# Patient Record
Sex: Male | Born: 1935
Health system: Southern US, Community
[De-identification: ages and names within clinical notes are randomized; demographics above are authoritative.]

## PROBLEM LIST (undated history)

## (undated) DIAGNOSIS — I4892 Unspecified atrial flutter: Secondary | ICD-10-CM

## (undated) DIAGNOSIS — R42 Dizziness and giddiness: Secondary | ICD-10-CM

## (undated) DIAGNOSIS — I10 Essential (primary) hypertension: Secondary | ICD-10-CM

## (undated) DIAGNOSIS — I499 Cardiac arrhythmia, unspecified: Secondary | ICD-10-CM

## (undated) HISTORY — DX: Cardiac arrhythmia, unspecified: I49.9

## (undated) HISTORY — PX: ANKLE ARTHROSCOPY: SUR85

## (undated) HISTORY — DX: Unspecified atrial flutter: I48.92

## (undated) HISTORY — DX: Dizziness and giddiness: R42

## (undated) HISTORY — PX: TONSILLECTOMY: SUR1361

---

## 2000-08-19 ENCOUNTER — Ambulatory Visit (HOSPITAL_COMMUNITY): Admission: RE | Admit: 2000-08-19 | Discharge: 2000-08-19 | Payer: Self-pay | Admitting: Gastroenterology

## 2001-03-10 ENCOUNTER — Emergency Department (HOSPITAL_COMMUNITY): Admission: EM | Admit: 2001-03-10 | Discharge: 2001-03-10 | Payer: Self-pay | Admitting: Emergency Medicine

## 2001-06-09 ENCOUNTER — Encounter: Payer: Self-pay | Admitting: Internal Medicine

## 2001-06-09 ENCOUNTER — Encounter: Admission: RE | Admit: 2001-06-09 | Discharge: 2001-06-09 | Payer: Self-pay | Admitting: Internal Medicine

## 2004-09-02 ENCOUNTER — Ambulatory Visit (HOSPITAL_BASED_OUTPATIENT_CLINIC_OR_DEPARTMENT_OTHER): Admission: RE | Admit: 2004-09-02 | Discharge: 2004-09-02 | Payer: Self-pay | Admitting: Orthopedic Surgery

## 2004-09-02 ENCOUNTER — Ambulatory Visit (HOSPITAL_COMMUNITY): Admission: RE | Admit: 2004-09-02 | Discharge: 2004-09-02 | Payer: Self-pay | Admitting: Orthopedic Surgery

## 2012-02-09 ENCOUNTER — Other Ambulatory Visit: Payer: Self-pay | Admitting: Dermatology

## 2013-07-10 ENCOUNTER — Other Ambulatory Visit: Payer: Self-pay | Admitting: Internal Medicine

## 2013-07-10 ENCOUNTER — Ambulatory Visit
Admission: RE | Admit: 2013-07-10 | Discharge: 2013-07-10 | Disposition: A | Discharge status: Home / Self Care | Payer: Medicare Other | Source: Ambulatory Visit | Attending: Internal Medicine | Admitting: Internal Medicine

## 2013-07-10 DIAGNOSIS — R05 Cough: Secondary | ICD-10-CM

## 2013-07-10 DIAGNOSIS — R059 Cough, unspecified: Secondary | ICD-10-CM

## 2013-08-08 ENCOUNTER — Other Ambulatory Visit: Payer: Self-pay | Admitting: Dermatology

## 2015-10-01 IMAGING — CR DG CHEST 2V
2 series · 2 of 2 positions shown · non-contrast
Comparison: None.

CLINICAL DATA: Persistent cough, fatigue

EXAM:
CHEST  2 VIEW

[view not recorded (1 of 2)]
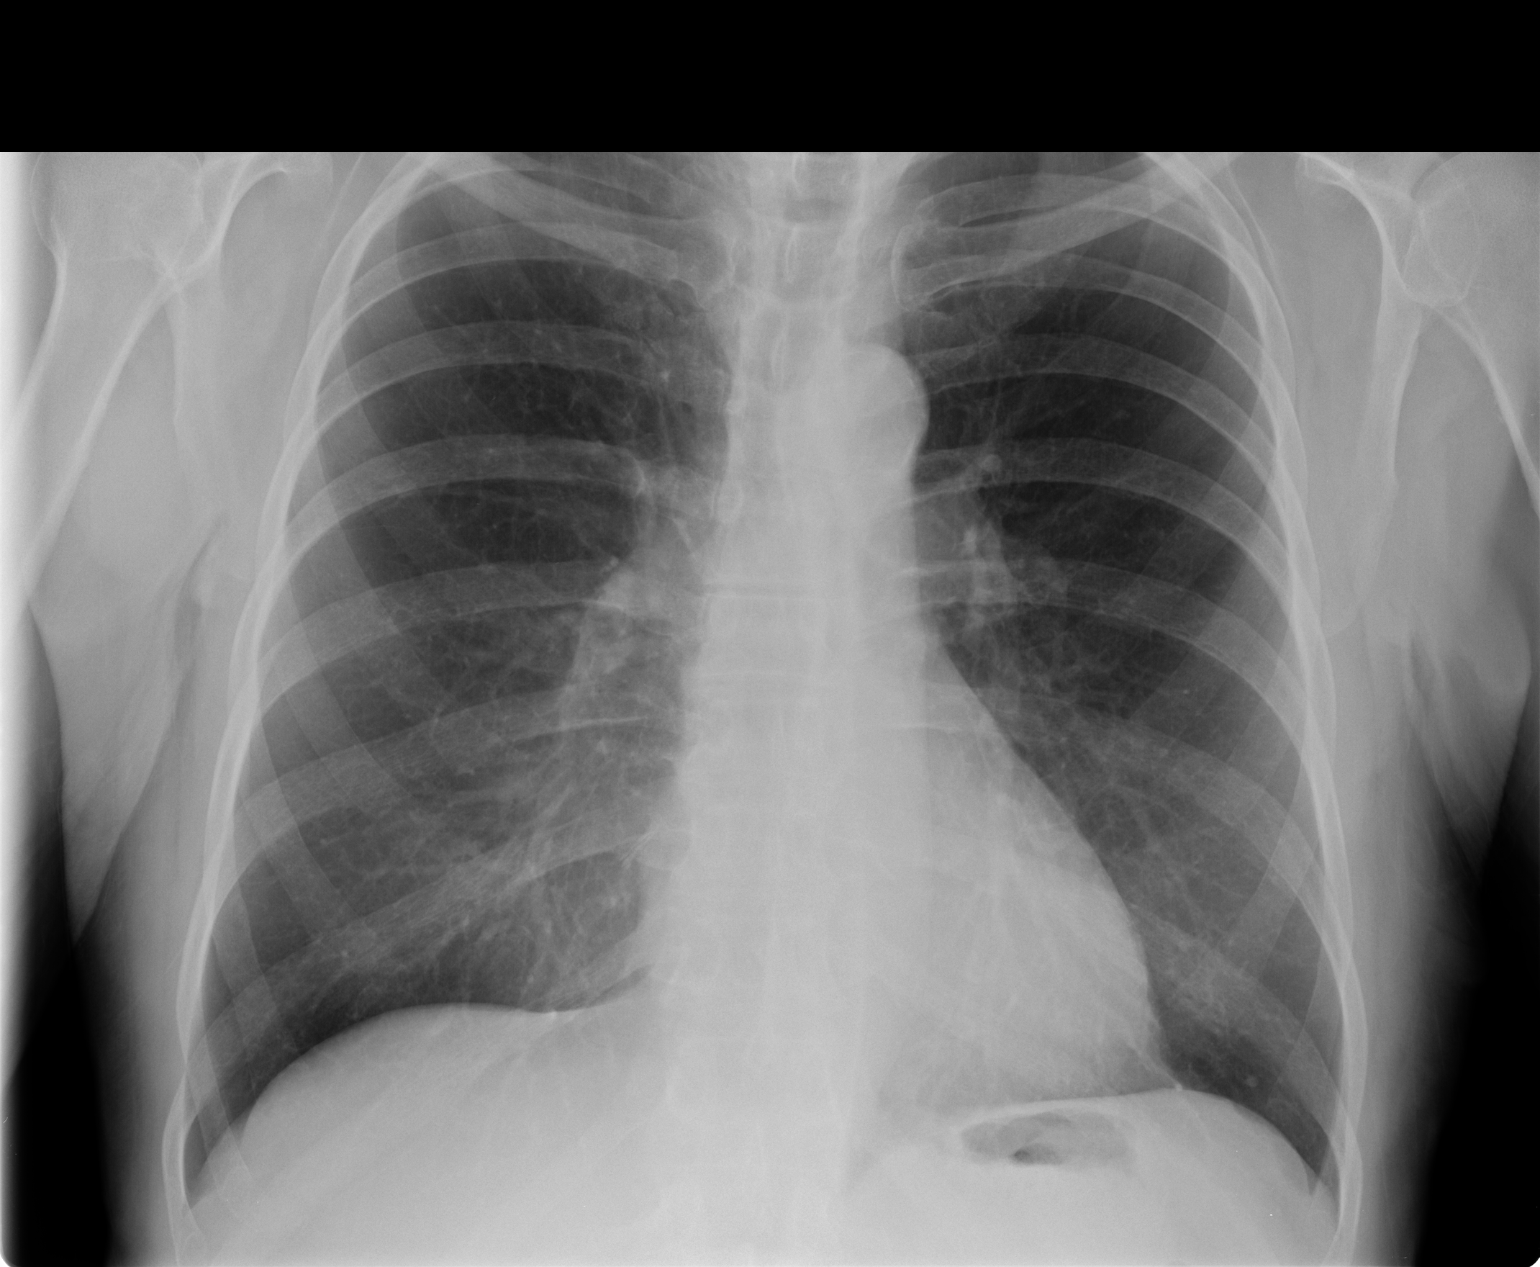

[view not recorded (2 of 2)]
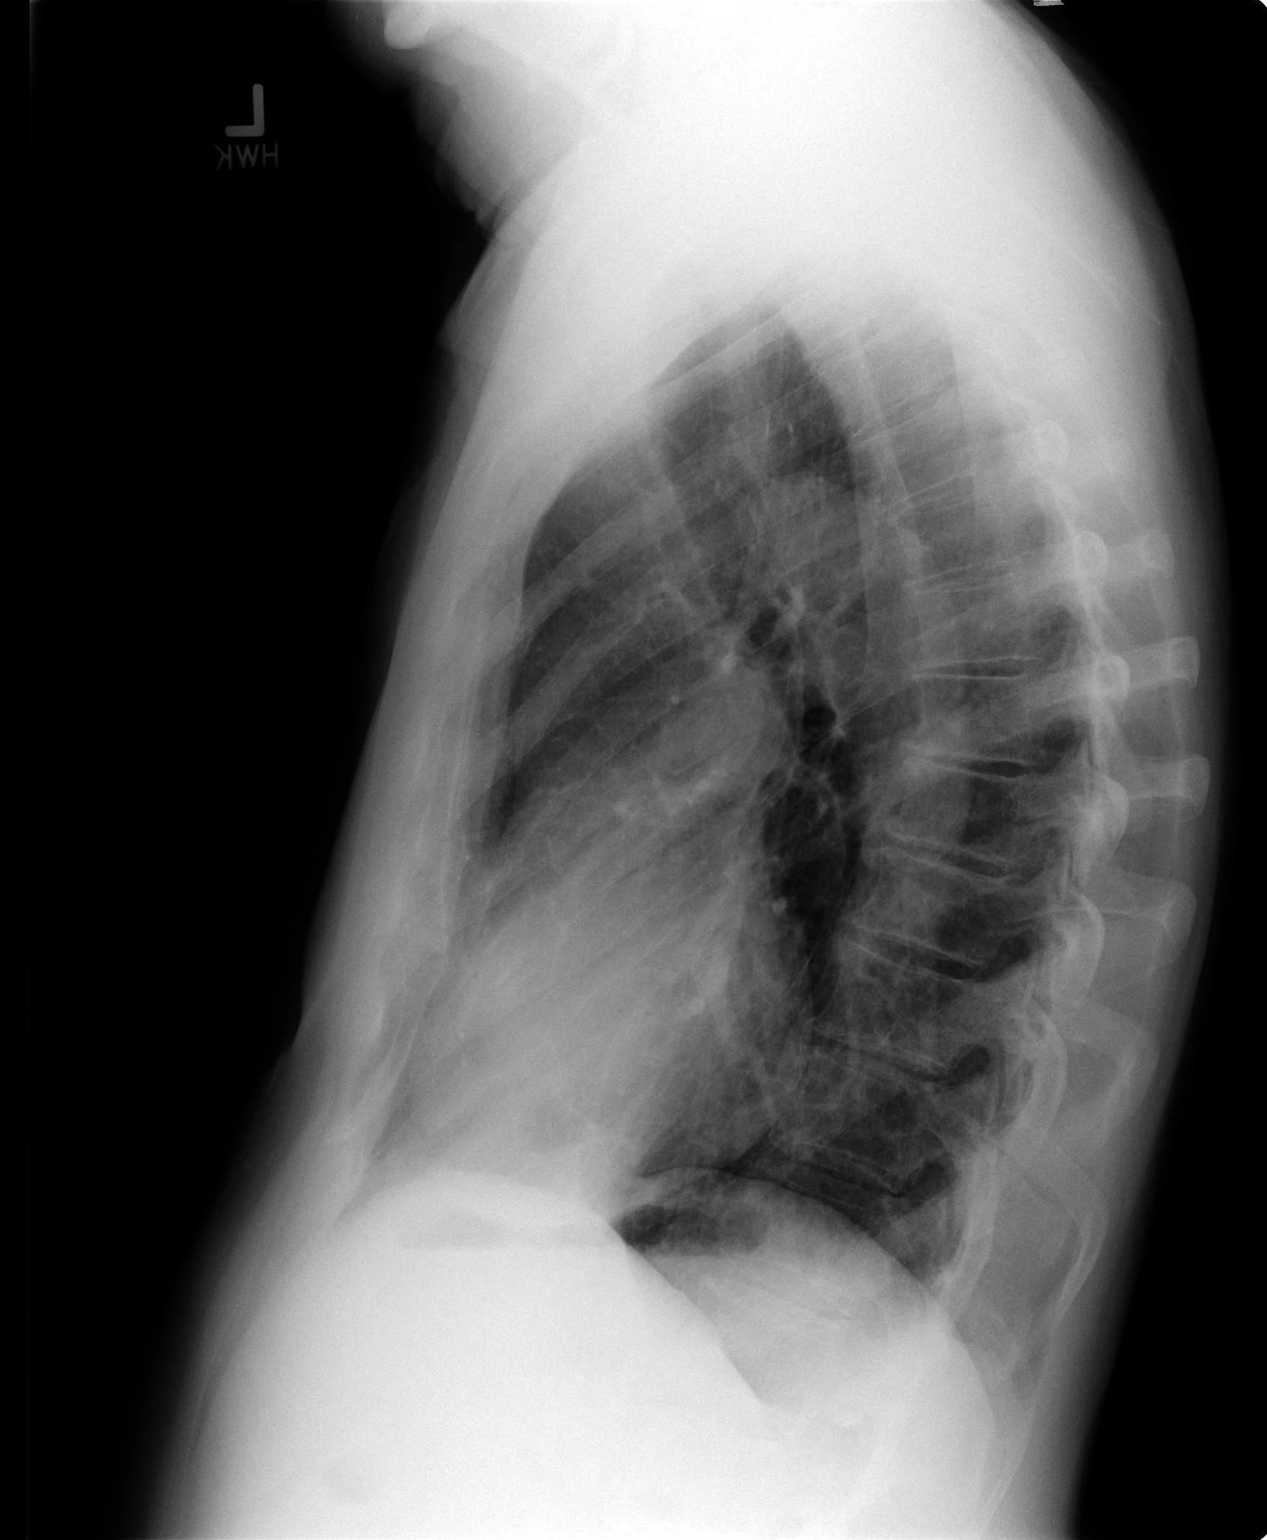

[2 of 2 positions shown; findings below may reference images not displayed]

FINDINGS: Lungs are clear.  No pleural effusion or pneumothorax.

The heart is normal in size.

Degenerative changes of the visualized thoracolumbar spine.
IMPRESSION: No evidence of acute cardiopulmonary disease.

## 2015-11-15 ENCOUNTER — Encounter (HOSPITAL_COMMUNITY): Payer: Self-pay | Admitting: *Deleted

## 2015-11-15 ENCOUNTER — Telehealth: Payer: Self-pay | Admitting: Internal Medicine

## 2015-11-15 ENCOUNTER — Emergency Department (HOSPITAL_COMMUNITY)
Admission: EM | Admit: 2015-11-15 | Discharge: 2015-11-15 | Disposition: A | Discharge status: Home / Self Care | Payer: Medicare Other | Attending: Emergency Medicine | Admitting: Emergency Medicine

## 2015-11-15 ENCOUNTER — Emergency Department (HOSPITAL_COMMUNITY): Payer: Medicare Other

## 2015-11-15 ENCOUNTER — Encounter: Payer: Self-pay | Admitting: *Deleted

## 2015-11-15 DIAGNOSIS — I4892 Unspecified atrial flutter: Secondary | ICD-10-CM | POA: Diagnosis not present

## 2015-11-15 DIAGNOSIS — Z87891 Personal history of nicotine dependence: Secondary | ICD-10-CM | POA: Insufficient documentation

## 2015-11-15 DIAGNOSIS — I10 Essential (primary) hypertension: Secondary | ICD-10-CM | POA: Diagnosis not present

## 2015-11-15 DIAGNOSIS — R42 Dizziness and giddiness: Secondary | ICD-10-CM | POA: Insufficient documentation

## 2015-11-15 DIAGNOSIS — R009 Unspecified abnormalities of heart beat: Secondary | ICD-10-CM | POA: Diagnosis present

## 2015-11-15 HISTORY — DX: Essential (primary) hypertension: I10

## 2015-11-15 LAB — BASIC METABOLIC PANEL
Anion gap: 9 (ref 5–15)
BUN: 18 mg/dL (ref 6–20)
CALCIUM: 9.5 mg/dL (ref 8.9–10.3)
CHLORIDE: 103 mmol/L (ref 101–111)
CO2: 23 mmol/L (ref 22–32)
CREATININE: 1.09 mg/dL (ref 0.61–1.24)
GFR calc Af Amer: >60 mL/min (ref >60)
GFR calc non Af Amer: 59 mL/min — ABNORMAL LOW (ref >60)
GLUCOSE: 101 mg/dL — AB (ref 65–99)
Potassium: 4.5 mmol/L (ref 3.5–5.1)
Sodium: 135 mmol/L (ref 135–145)

## 2015-11-15 LAB — CBC
HCT: 45.4 % (ref 39.0–52.0)
HEMOGLOBIN: 15 g/dL (ref 13.0–17.0)
MCH: 32.2 pg (ref 26.0–34.0)
MCHC: 33 g/dL (ref 30.0–36.0)
MCV: 97.4 fL (ref 78.0–100.0)
PLATELETS: 170 10*3/uL (ref 150–400)
RBC: 4.66 MIL/uL (ref 4.22–5.81)
RDW: 12.6 % (ref 11.5–15.5)
WBC: 8.2 10*3/uL (ref 4.0–10.5)

## 2015-11-15 LAB — I-STAT TROPONIN, ED: TROPONIN I, POC: 0 ng/mL (ref 0.00–0.08)

## 2015-11-15 NOTE — ED Provider Notes (Signed)
[facility] DEPT Provider Note   CSN: [id number] Arrival date & time: 11/15/15  1537  First Provider Contact:  First MD Initiated Contact with Patient 11/15/15 1741      History   Chief Complaint Chief Complaint  Patient presents with  . Irregular Heart Beat   HPI  Hardin Dar is an 82 y.o. male with history of HTN who presents to the ED from his PCP office for evaluation of new onset afib. Pt was seen at PCP today for evaluation of dizziness. Pt states for the past year to year and a half he has been experiencing episodes of dizziness that he describes as "debilitating." The episodes last 15-20 minutes and he feels dizzy, off balance, like the room is spinning, with unsteady gait. His previous PCP diagnosed his symptoms as vertigo; his symptoms have not resolved with vestibular rehab. Pt states this morning he felt one of the episodes coming on as he was sitting at the side of the bed so he made an appointment with his new PCP for check-up. Pt states he has never had afib before. He denies chest pain or shortness of breath. Denies palpitations. Denies dizziness currently in the ED. He denies numbness, weakness, or tingling.   Past Medical History:  Diagnosis Date  . Hypertension     There are no active problems to display for this patient.   No past surgical history on file.   Home Medications    Prior to Admission medications   Not on File    Family History No family history on file.  Social History Social History  Substance Use Topics  . Smoking status: Former Research scientist (life sciences)  . Smokeless tobacco: Never Used  . Alcohol use Yes     Allergies   Sulfa antibiotics   Review of Systems Review of Systems 10 Systems reviewed and are negative for acute change except as noted in the HPI.   Physical Exam Updated Vital Signs BP (!) 184/127   Pulse 88   Temp 98.2 F (36.8 C) (Oral)   Ht 5' 8.5" (1.74 m)   Wt 72.6 kg   SpO2 98%   BMI 23.97 kg/m   Physical Exam    Constitutional: He is oriented to person, place, and time.  HENT:  Right Ear: External ear normal.  Left Ear: External ear normal.  Nose: Nose normal.  Mouth/Throat: Oropharynx is clear and moist. No oropharyngeal exudate.  Eyes: Conjunctivae and EOM are normal. Pupils are equal, round, and reactive to light.  Neck: Normal range of motion. Neck supple.  Cardiovascular: Normal rate, normal heart sounds and intact distal pulses.  An irregularly irregular rhythm present.  Pulmonary/Chest: Effort normal and breath sounds normal. No respiratory distress. He has no wheezes.  Abdominal: Soft. Bowel sounds are normal. He exhibits no distension. There is no tenderness.  Musculoskeletal: He exhibits no edema.  Neurological: He is alert and oriented to person, place, and time. No cranial nerve deficit.  Normal finger to nose No pronator drift 5/5 strength bilateral UE and LE  Skin: Skin is warm and dry.  Psychiatric: He has a normal mood and affect.  Nursing note and vitals reviewed.    ED Treatments / Results  Labs (all labs ordered are listed, but only abnormal results are displayed) Labs Reviewed  BASIC METABOLIC PANEL - Abnormal; Notable for the following:       Result Value   Glucose, Bld 101 (*)    GFR calc non Af Amer 59 (*)  All other components within normal limits  CBC  I-STAT TROPOININ, ED    EKG  EKG Interpretation None       Radiology Dg Chest 2 View  Result Date: 11/15/2015 CLINICAL DATA:  Anus.  New onset atrial fibrillation. EXAM: CHEST  2 VIEW COMPARISON:  Two-view chest x-ray 07/10/2013. FINDINGS: The heart size and mediastinal contours are within normal limits. Both lungs are clear. The visualized skeletal structures are unremarkable. IMPRESSION: Negative two view chest x-ray. Electronically Signed   By: San Morelle M.D.   On: 11/15/2015 17:19  Mr Brain Wo Contrast  Result Date: 11/15/2015 CLINICAL DATA:  Vertigo. New onset of atrial fibrillation.  Dizziness. Hypertension. EXAM: MRI HEAD WITHOUT CONTRAST TECHNIQUE: Multiplanar, multiecho pulse sequences of the brain and surrounding structures were obtained without intravenous contrast. COMPARISON:  None. FINDINGS: No evidence for acute infarction, hemorrhage, mass lesion, hydrocephalus, or extra-axial fluid. Global atrophy, with prominence of the ventricles, cisterns, and sulci. Moderately advanced T2 and FLAIR hyperintensities throughout the white matter, consistent with chronic microvascular ischemic change. Flow voids are maintained throughout the carotid, basilar, and vertebral arteries. There are no areas of chronic hemorrhage. Pituitary, pineal, and cerebellar tonsils unremarkable. No upper cervical lesions. Visualized calvarium, skull base, and upper cervical osseous structures unremarkable. Scalp and extracranial soft tissues, orbits, sinuses, and mastoids show no acute process. IMPRESSION: Chronic changes as described. No acute intracranial findings. No cerebral infarct/embolus is detected. Electronically Signed   By: Staci Righter M.D.   On: 11/15/2015 20:13   Procedures Procedures (including critical care time)  Medications Ordered in ED Medications - No data to display   Initial Impression / Assessment and Plan / ED Course  I have reviewed the triage vital signs and the nursing notes.  Pertinent labs & imaging results that were available during my care of the patient were reviewed by me and considered in my medical decision making (see chart for details).  Clinical Course   Pt presenting from PCP office with new onset afib. Only symptoms are intermittent dizziness over the past year. Most recent episode occurred this morning. Now in the ED pt denies dizziness though feels if he gets up to walk around it might return. Workup thus far is negative including troponin, CXR, CBC, and BMP. We will obtain MRI to r/o stroke or other emergent intra-cranial abnormality. Will consult  cardiology.  6:50 PM Spoke with Dr. Domenic Polite of cardiology. EKG looks more like flutter, good rate control. If CNS workup is negative in the ED we may start conversation about anticoagulation with pt but ultimately might be better for pt to decide with PCP regarding anticoagulation choice, though NOAC would be his suggestion. Doesn't look like we need to start anticoagulation acutely. No indication for admission at this point.  MRI  Negative. Pt is asymptomatic. He is hemodynamically stable and stable for discharge. We did discuss different anticoagulation options (CHA2DS2VASc 3) but ultimately he will have close f/u with cardiology and PCP next week for further discussion and long term management. Strict ER return precautions given.  Final Clinical Impressions(s) / ED Diagnoses   Final diagnoses:  Atrial flutter, unspecified type ([place])  Dizziness    New Prescriptions There are no discharge medications for this patient.    Anne Ng, PA-C 11/16/15 [place], MD 11/16/15 1026

## 2015-11-15 NOTE — Discharge Instructions (Signed)
Follow up with your primary care provider and the [facility] as soon as possible early next week. Return to the ER for new or worsening symptoms.

## 2015-11-15 NOTE — ED Notes (Signed)
Report given to Susanna RN.

## 2015-11-15 NOTE — ED Triage Notes (Signed)
The pt is here after going to his doctor s office for a check up.  He was sent here when they did his ekg and found him to be in af  Which is new no pain of any kind  This is new onset

## 2015-11-15 NOTE — ED Notes (Signed)
Taken to MRI at this time

## 2015-11-15 NOTE — Evaluation (Signed)
     Phone consultation from Ms. Sam PA-C regarding Mr. [place]. Patient sent to ER by PCP today with newly documented atrial arrhythmia. He has a history of hypertension, no reported bleeding problems, no prior history of stroke or diabetes mellitus. ECG shows atrial flutter with 3:1 block and controlled heart rate. No reported palpitations or chest pain per discussion with Ms. Sam PA-C. He has had some dizziness, and workup for possible CNS event is underway with head MRI pending. Medications not yet loaded into Epic, reportedly on 1 antihypertensive, not clear whether this is an AV nodal blocker or not. He may well have underlying conduction system disease at age 40 with controlled heart rate in atrial flutter. Patient is noted to be hypertensive, lab work shows creatinine 1.1, potassium 4.5, troponin I 0.0, hemoglobin 15, and platelets 170. Chest x-ray without acute findings.  At this point agree with workup to exclude CNS event that would prompt hospitalization and more prompt initiation of anticoagulation. If this does not turn out to be the case and the decision is to discharge him home, he may well be able to undergo further outpatient cardiology evaluation with echocardiography, review of old charts to see if this rhythm is truly new or just newly documented, and discussion about appropriateness of anticoagulation for stroke prophylaxis. CHADSVASC score is 2. Eliquis might be a reasonable option for him. It is not clear at this point that cardioversion needs to be considered.  Satira Sark, M.D., F.A.C.C.

## 2015-11-19 ENCOUNTER — Encounter: Payer: Self-pay | Admitting: Internal Medicine

## 2015-11-19 ENCOUNTER — Encounter: Payer: Self-pay | Admitting: Interventional Cardiology

## 2015-11-19 ENCOUNTER — Telehealth (HOSPITAL_COMMUNITY): Payer: Self-pay | Admitting: *Deleted

## 2015-11-19 NOTE — Telephone Encounter (Signed)
Left message to schedule appt for follow up ER visit.

## 2015-12-13 ENCOUNTER — Ambulatory Visit: Payer: Medicare Other | Admitting: Internal Medicine

## 2016-05-15 ENCOUNTER — Other Ambulatory Visit: Payer: Self-pay | Admitting: Cardiology

## 2016-05-15 DIAGNOSIS — H34211 Partial retinal artery occlusion, right eye: Secondary | ICD-10-CM

## 2016-05-18 ENCOUNTER — Ambulatory Visit (HOSPITAL_COMMUNITY)
Admission: RE | Admit: 2016-05-18 | Discharge: 2016-05-18 | Disposition: A | Discharge status: Home / Self Care | Payer: Medicare Other | Source: Ambulatory Visit | Attending: Surgery | Admitting: Surgery

## 2016-05-18 DIAGNOSIS — H34211 Partial retinal artery occlusion, right eye: Secondary | ICD-10-CM | POA: Diagnosis not present

## 2016-05-18 DIAGNOSIS — I6523 Occlusion and stenosis of bilateral carotid arteries: Secondary | ICD-10-CM | POA: Diagnosis not present

## 2016-05-18 DIAGNOSIS — H34219 Partial retinal artery occlusion, unspecified eye: Secondary | ICD-10-CM | POA: Diagnosis present

## 2016-05-18 LAB — VAS US CAROTID
LCCAPDIAS: 17 cm/s
LEFT ECA DIAS: -12 cm/s
LEFT VERTEBRAL DIAS: -13 cm/s
LICAPDIAS: -17 cm/s
LICAPSYS: -61 cm/s
Left CCA dist dias: -15 cm/s
Left CCA dist sys: -73 cm/s
Left CCA prox sys: 104 cm/s
Left ICA dist dias: -29 cm/s
Left ICA dist sys: -108 cm/s
RIGHT CCA MID DIAS: 12 cm/s
RIGHT ECA DIAS: -12 cm/s
RIGHT VERTEBRAL DIAS: -9 cm/s
Right CCA prox dias: 14 cm/s
Right CCA prox sys: 93 cm/s
Right cca dist sys: -89 cm/s

## 2018-03-29 ENCOUNTER — Telehealth: Payer: Self-pay | Admitting: Cardiology

## 2018-04-12 NOTE — Telephone Encounter (Signed)
Done

## 2018-05-24 NOTE — Progress Notes (Signed)
Cardiology Office Note   Date:  05/30/2018   ID:  Tejuan Gholson, DOB February 15, 1927, MRN [id number]  PCP:  Leeroy Cha, MD  Cardiologist:   Shadrach Bartunek [place], MD   Chief Complaint  Patient presents with  . Atrial Flutter      History of Present Illness: Scott Richards is a 85 y.o. male who is seen at the request of Dr. Fara Olden for evaluation of cardiac arrhythmia. He is a former patient of Dr [name]. He has a history of HTN and atrial flutter. He was seen in the ED in July 2017 with newly recognized Atrial flutter with 4:1 AV conduction. Rate 48 bpm. MRI brain showed no acute change. Seen by Dr. [name] and started on Xarelto. Echo showed normal LV function. Mild to moderate AI and moderate MR. Subsequently converted to NSR.   He has done well since then. Denies any chest pain, palpitations, dyspnea or edema. He does have intermittent vertigo. Tolerating medication well. He is active and enjoys woodworking.    Past Medical History:  Diagnosis Date  . Arrhythmia   . Atrial flutter ([place])   . Hypertension   . Vertigo     Past Surgical History:  Procedure Laterality Date  . ANKLE ARTHROSCOPY    . TONSILLECTOMY       Current Outpatient Medications  Medication Sig Dispense Refill  . rivaroxaban (XARELTO) 20 MG TABS tablet Take 20 mg by mouth daily with supper.    . telmisartan (MICARDIS) 20 MG tablet Take 20 mg by mouth daily.      No current facility-administered medications for this visit.     Allergies:   Sulfa antibiotics    Social History:  The patient  reports that he has quit smoking. He has never used smokeless tobacco. He reports current alcohol use.   Family History:  The patient's family history includes Dementia in his mother.    ROS:  Please see the history of present illness.   Otherwise, review of systems are positive for none.   All other systems are reviewed and negative.    PHYSICAL EXAM: VS:  BP (!) 148/74   Pulse 70   Ht 5' 8.5" (1.74 m)    Wt 153 lb 6.4 oz (69.6 kg)   SpO2 98%   BMI 22.99 kg/m  , BMI Body mass index is 22.99 kg/m. GEN: Well nourished, well developed, in no acute distress  HEENT: normal  Neck: no JVD, carotid bruits, or masses Cardiac: RRR; 1/6 systolic  Murmur at the apex, no rubs, or gallops,no edema  Respiratory:  clear to auscultation bilaterally, normal work of breathing GI: soft, nontender, nondistended, + BS MS: no deformity or atrophy  Skin: warm and dry, no rash Neuro:  Strength and sensation are intact Psych: euthymic mood, full affect   EKG:  EKG is ordered today. The ekg ordered today demonstrates NSR with first degree AV block. One PVC. Otherwise normal. I have personally reviewed and interpreted this study.    Recent Labs: No results found for requested labs within last 8760 hours.    Lipid Panel No results found for: CHOL, TRIG, HDL, CHOLHDL, VLDL, LDLCALC, LDLDIRECT   Dated 11/24/17: cholesterol 179, triglycerides 87. HDL 53, LDL 109. Hgb normal Dated 05/25/18: normal BMET  Wt Readings from Last 3 Encounters:  05/30/18 153 lb 6.4 oz (69.6 kg)  11/15/15 160 lb (72.6 kg)      Other studies Reviewed: Additional studies/ records that were reviewed today include: office records from  Dr [name]. Review of the above records demonstrates: as noted above   ASSESSMENT AND PLAN:  1.  Paroxysmal AFlutter. Asymptomatic. Continue Xarelto for [place] Vasc score of 2.  2. HTN on Micardis.  Plan: follow up in one year.   Current medicines are reviewed at length with the patient today.  The patient does not have concerns regarding medicines.  The following changes have been made:  no change  Labs/ tests ordered today include: none No orders of the defined types were placed in this encounter.    Disposition:   FU with me  in 1 year  Signed, Chamberlain Steinborn [place], MD  05/30/2018 1:38 PM    [facility] [address], [place], [place], [address] Phone [phone],  Fax [phone]

## 2018-05-30 ENCOUNTER — Ambulatory Visit: Payer: Medicare Other | Admitting: Cardiology

## 2018-05-30 ENCOUNTER — Encounter: Payer: Self-pay | Admitting: Cardiology

## 2018-05-30 VITALS — BP 148/74 | HR 70 | Ht 68.5 in | Wt 153.4 lb

## 2018-05-30 DIAGNOSIS — I483 Typical atrial flutter: Secondary | ICD-10-CM | POA: Diagnosis not present

## 2019-06-08 ENCOUNTER — Telehealth: Payer: Self-pay

## 2019-06-08 NOTE — Progress Notes (Deleted)
Cardiology Office Note   Date:  06/08/2019   ID:  Scott Richards, DOB 1926-08-30, MRN [id number]  PCP:  Leeroy Cha, MD  Cardiologist:   Dai Apel [place], MD   No chief complaint on file.     History of Present Illness: Scott Richards is a 86 y.o. male who is seen at the request of Dr. Fara Olden for evaluation of cardiac arrhythmia. He is a former patient of Dr [name]. He has a history of HTN and atrial flutter. He was seen in the ED in July 2017 with newly recognized Atrial flutter with 4:1 AV conduction. Rate 48 bpm. MRI brain showed no acute change. Seen by Dr. [name] and started on Xarelto. Echo showed normal LV function. Mild to moderate AI and moderate MR. Subsequently converted to NSR.   He has done well since then. Denies any chest pain, palpitations, dyspnea or edema. He does have intermittent vertigo. Tolerating medication well. He is active and enjoys woodworking.    Past Medical History:  Diagnosis Date  . Arrhythmia   . Atrial flutter ([place])   . Hypertension   . Vertigo     Past Surgical History:  Procedure Laterality Date  . ANKLE ARTHROSCOPY    . TONSILLECTOMY       Current Outpatient Medications  Medication Sig Dispense Refill  . rivaroxaban (XARELTO) 20 MG TABS tablet Take 20 mg by mouth daily with supper.    . telmisartan (MICARDIS) 20 MG tablet Take 20 mg by mouth daily.      No current facility-administered medications for this visit.    Allergies:   Sulfa antibiotics    Social History:  The patient  reports that he has quit smoking. He has never used smokeless tobacco. He reports current alcohol use.   Family History:  The patient's family history includes Dementia in his mother.    ROS:  Please see the history of present illness.   Otherwise, review of systems are positive for none.   All other systems are reviewed and negative.    PHYSICAL EXAM: VS:  There were no vitals taken for this visit. , BMI There is no height or weight on  file to calculate BMI. GEN: Well nourished, well developed, in no acute distress  HEENT: normal  Neck: no JVD, carotid bruits, or masses Cardiac: RRR; 1/6 systolic  Murmur at the apex, no rubs, or gallops,no edema  Respiratory:  clear to auscultation bilaterally, normal work of breathing GI: soft, nontender, nondistended, + BS MS: no deformity or atrophy  Skin: warm and dry, no rash Neuro:  Strength and sensation are intact Psych: euthymic mood, full affect   EKG:  EKG is ordered today. The ekg ordered today demonstrates NSR with first degree AV block. One PVC. Otherwise normal. I have personally reviewed and interpreted this study.    Recent Labs: No results found for requested labs within last 8760 hours.    Lipid Panel No results found for: CHOL, TRIG, HDL, CHOLHDL, VLDL, LDLCALC, LDLDIRECT   Dated 11/24/17: cholesterol 179, triglycerides 87. HDL 53, LDL 109. Hgb normal Dated 05/25/18: normal BMET Dated 11/28/18: cholesterol 171, triglycerides 88, HDL 56, LDL 97. Creatinine 1.21. CBC and CMET otherwise normal.  Wt Readings from Last 3 Encounters:  05/30/18 153 lb 6.4 oz (69.6 kg)  11/15/15 160 lb (72.6 kg)      Other studies Reviewed: Additional studies/ records that were reviewed today include: office records from Dr [name]. Review of the above records demonstrates: as noted  above   ASSESSMENT AND PLAN:  1.  Paroxysmal AFlutter. Asymptomatic. Continue Xarelto for [place] Vasc score of 2.  2. HTN on Micardis.  Plan: follow up in one year.   Current medicines are reviewed at length with the patient today.  The patient does not have concerns regarding medicines.  The following changes have been made:  no change  Labs/ tests ordered today include: none No orders of the defined types were placed in this encounter.    Disposition:   FU with me  in 1 year  Signed, Scott Mcnee [place], MD  06/08/2019 7:26 AM    [facility] [address],  [place], [place], [address] Phone [phone], Fax [phone]

## 2019-06-08 NOTE — Telephone Encounter (Signed)
Called patient left message on personal voice mail appointment scheduled with Dr.Jordan 2/19 at 9:40 am has been cancelled due to the weather.Advised to call back to reschedule.

## 2019-06-09 ENCOUNTER — Encounter (INDEPENDENT_AMBULATORY_CARE_PROVIDER_SITE_OTHER): Payer: Self-pay

## 2019-06-09 ENCOUNTER — Other Ambulatory Visit: Payer: Self-pay

## 2019-06-09 ENCOUNTER — Ambulatory Visit: Payer: Medicare Other | Admitting: Cardiology

## 2019-06-09 ENCOUNTER — Encounter: Payer: Self-pay | Admitting: Cardiology

## 2019-06-09 VITALS — BP 156/82 | HR 72 | Temp 97.3°F | Ht 68.5 in | Wt 159.0 lb

## 2019-06-09 DIAGNOSIS — I483 Typical atrial flutter: Secondary | ICD-10-CM

## 2019-06-09 DIAGNOSIS — I441 Atrioventricular block, second degree: Secondary | ICD-10-CM | POA: Diagnosis not present

## 2019-06-09 NOTE — Progress Notes (Signed)
Cardiology Office Note   Date:  06/09/2019   ID:  Nichole Fyffe, DOB 12-Feb-1927, MRN [id number]  PCP:  Leeroy Cha, MD  Cardiologist:   Braelen Sproule [place], MD   Chief Complaint  Patient presents with  . Follow-up    12 months.      History of Present Illness: Assad Muhammed is a 86 y.o. male who is seen for follow up atrial flutter. He is a former patient of Dr [name]. He has a history of HTN and atrial flutter. He was seen in the ED in July 2017 with newly recognized Atrial flutter with 4:1 AV conduction. Rate 48 bpm. MRI brain showed no acute change. Seen by Dr. [name] and started on Xarelto. Echo showed normal LV function. Mild to moderate AI and moderate MR. Subsequently converted to NSR.   He has done well since then. Denies any chest pain, palpitations, dyspnea or edema. Notes an occasional dropped beat on his BP monitor.  Tolerating medication well. He is active. Denies any bleeding.    Past Medical History:  Diagnosis Date  . Arrhythmia   . Atrial flutter ([place])   . Hypertension   . Vertigo     Past Surgical History:  Procedure Laterality Date  . ANKLE ARTHROSCOPY    . TONSILLECTOMY       Current Outpatient Medications  Medication Sig Dispense Refill  . cetirizine (ZYRTEC) 10 MG tablet Take 10 mg by mouth daily.    . rivaroxaban (XARELTO) 20 MG TABS tablet Take 20 mg by mouth daily with supper.    . telmisartan (MICARDIS) 20 MG tablet Take 20 mg by mouth daily.      No current facility-administered medications for this visit.    Allergies:   Sulfa antibiotics    Social History:  The patient  reports that he has quit smoking. He has never used smokeless tobacco. He reports current alcohol use.   Family History:  The patient's family history includes Dementia in his mother.    ROS:  Please see the history of present illness.   Otherwise, review of systems are positive for none.   All other systems are reviewed and negative.    PHYSICAL EXAM: VS:   Ht 5' 8.5" (1.74 m)   Wt 159 lb (72.1 kg)   BMI 23.82 kg/m  , BMI Body mass index is 23.82 kg/m. GEN: Well nourished, well developed, in no acute distress  HEENT: normal  Neck: no JVD, carotid bruits, or masses Cardiac: RRR; 1/6 systolic murmur RUSB to apex, no rubs, or gallops,no edema  Respiratory:  clear to auscultation bilaterally, normal work of breathing GI: soft, nontender, nondistended, + BS MS: no deformity or atrophy  Skin: warm and dry, no rash Neuro:  Strength and sensation are intact Psych: euthymic mood, full affect   EKG:  EKG is ordered today. The ekg ordered today demonstrates NSR with second degree AV block Mobitz type 1.  Otherwise normal. I have personally reviewed and interpreted this study.    Recent Labs: No results found for requested labs within last 8760 hours.    Lipid Panel No results found for: CHOL, TRIG, HDL, CHOLHDL, VLDL, LDLCALC, LDLDIRECT   Dated 11/24/17: cholesterol 179, triglycerides 87. HDL 53, LDL 109. Hgb normal Dated 05/25/18: normal BMET Dated 11/28/18: cholesterol 171, triglycerides 88, HDL 56, LDL 97. Creatinine 1.21. CBC and CMET otherwise normal.  Wt Readings from Last 3 Encounters:  06/09/19 159 lb (72.1 kg)  05/30/18 153 lb 6.4 oz (69.6  kg)  11/15/15 160 lb (72.6 kg)      Other studies Reviewed: Additional studies/ records that were reviewed today include: office records from Dr [name]. Review of the above records demonstrates: as noted above   ASSESSMENT AND PLAN:  1.  Paroxysmal AFlutter. Asymptomatic. Continue Xarelto for [place] Vasc score of 2.  2. HTN on Micardis. 3. Second degree AV block Mobitz type 1. Asymptomatic. HR is fine. No indication for pacemaker.   Plan: follow up in one year.   Current medicines are reviewed at length with the patient today.  The patient does not have concerns regarding medicines.  The following changes have been made:  no change  Labs/ tests ordered today include: none No orders of  the defined types were placed in this encounter.    Disposition:   FU with me  in 1 year  Signed, Shantea Poulton [place], MD  06/09/2019 2:38 PM    [facility] [address], [place], [place], [address] Phone [phone], Fax [phone]

## 2019-08-22 ENCOUNTER — Telehealth: Payer: Self-pay | Admitting: Cardiology

## 2019-08-22 NOTE — Telephone Encounter (Signed)
New message   STAT if patient feels like he/she is going to faint   1) Are you dizzy now? No   2) Do you feel faint or have you passed out? Patient passed out for a few seconds about a week ago   3) Do you have any other symptoms? No   4) Have you checked your HR and BP (record if available)? Patient states that both are normal

## 2019-08-22 NOTE — Progress Notes (Signed)
Cardiology Office Note   Date:  08/23/2019   ID:  Scott Richards, DOB September 18, 1926, MRN [id number]  PCP:  Leeroy Cha, MD  Cardiologist:   Dezaray Shibuya Martinique, MD   No chief complaint on file.     History of Present Illness: Scott Richards is a 86 y.o. male who is seen for evaluation of syncope. He is a former patient of Dr [name]. He has a history of HTN and atrial flutter. He was seen in the ED in July 2017 with newly recognized Atrial flutter with 4:1 AV conduction. Rate 48 bpm. MRI brain showed no acute change. Seen by Dr. [name] and started on Xarelto. Echo showed normal LV function. Mild to moderate AI and moderate MR. Subsequently converted to NSR.   He was seen by me in February. At that time he was doing well.  He did note an occasional dropped beat on his BP monitor.  Ecg at that time showed NSR with second degree Mobitz type 1 AV block with HR 72. Since then he has experienced episodes of blacking out. Due to back problems he has to turn around when he is getting out of bed. The first episode occurred while getting out of bed and he blacked out suddenly without warning. Felt like he was floating over the bed. Only lasted about 15 seconds. Since then he has had 3-4 more episodes. He was seen by Primary care and Ecg showed atrial flutter with rate 53 bpm. A Zio patch monitor was placed. With monitor in place he has had some spells when he gets dizzy and feels like he might faint.     Past Medical History:  Diagnosis Date  . Arrhythmia   . Atrial flutter ([place])   . Hypertension   . Vertigo     Past Surgical History:  Procedure Laterality Date  . ANKLE ARTHROSCOPY    . TONSILLECTOMY       Current Outpatient Medications  Medication Sig Dispense Refill  . cetirizine (ZYRTEC) 10 MG tablet Take 10 mg by mouth daily.    Marland Kitchen telmisartan (MICARDIS) 20 MG tablet Take 20 mg by mouth daily.     Alveda Reasons 15 MG TABS tablet Take 15 mg by mouth daily.     No current  facility-administered medications for this visit.    Allergies:   Sulfa antibiotics    Social History:  The patient  reports that he has quit smoking. He has never used smokeless tobacco. He reports current alcohol use.   Family History:  The patient's family history includes Dementia in his mother.    ROS:  Please see the history of present illness.   Otherwise, review of systems are positive for none.   All other systems are reviewed and negative.    PHYSICAL EXAM: VS:  BP 140/82   Pulse 65   Temp (!) 97.3 F (36.3 C)   Ht 5' 8.5" (1.74 m)   Wt 158 lb (71.7 kg)   SpO2 97%   BMI 23.67 kg/m  , BMI Body mass index is 23.67 kg/m. GEN: Well nourished, well developed, in no acute distress  HEENT: normal  Neck: no JVD, carotid bruits, or masses Cardiac: RRR; 1/6 systolic murmur RUSB to apex, no rubs, or gallops,no edema  Respiratory:  clear to auscultation bilaterally, normal work of breathing GI: soft, nontender, nondistended, + BS MS: no deformity or atrophy  Skin: warm and dry, no rash Neuro:  Strength and sensation are intact Psych: euthymic mood, full  affect   EKG:  EKG is not ordered today. The ekg done 08/14/19 reviewed Atrial flutter with rate 53. I have personally reviewed and interpreted this study.     Recent Labs: No results found for requested labs within last 8760 hours.    Lipid Panel No results found for: CHOL, TRIG, HDL, CHOLHDL, VLDL, LDLCALC, LDLDIRECT   Dated 11/24/17: cholesterol 179, triglycerides 87. HDL 53, LDL 109. Hgb normal Dated 05/25/18: normal BMET Dated 11/28/18: cholesterol 171, triglycerides 88, HDL 56, LDL 97. Creatinine 1.21. CBC and CMET otherwise normal.  Wt Readings from Last 3 Encounters:  08/23/19 158 lb (71.7 kg)  06/09/19 159 lb (72.1 kg)  05/30/18 153 lb 6.4 oz (69.6 kg)      Other studies Reviewed: Additional studies/ records that were reviewed today include: office records from Dr [name]. Review of the above records  demonstrates: as noted above   ASSESSMENT AND PLAN:  1.  Paroxysmal AFlutter.  On Xarelto for [place] Vasc score of 2.  2. HTN on Micardis. 3. Second degree AV block Mobitz type 1.  4. History of syncope. Episodes are brief and without warning strongly suggestive of arrhythmia. Suspect bradycardia or pause. Will await results of Zio patch monitor. If we can document will refer to EP for consideration of pacemaker. I have reviewed recommendation that he not drive.    Signed, Jasenia Weilbacher Martinique, MD  08/23/2019 4:57 PM    [facility] [address] [place], [place], [address] Phone [phone], Fax [phone]

## 2019-08-22 NOTE — Telephone Encounter (Signed)
Spoke with patient. Patient reports he missed a call from our office yesterday informing him to call and make an appointment. He called back this morning to do so.   Since he has seen Dr. Martinique he has began to have episodes where he passes out for a few seconds. It has happened about 3-4 times in the last week. Patient is currently wearing a heart monitor prescribed by his PCP. Patient has seen his PCP for these episodes but wants to see Dr. Martinique.   Patient does not check his blood pressure or pulse daily. He is unable to do so at time of call. Patient is asymptomatic at time of call.   Patient educated to drink fluids and change positions slowly. Patient to push button on heart monitor when episodes occur. Patient to call back is symptoms worsen or change.

## 2019-08-23 ENCOUNTER — Ambulatory Visit: Payer: Medicare Other | Admitting: Cardiology

## 2019-08-23 ENCOUNTER — Encounter: Payer: Self-pay | Admitting: Cardiology

## 2019-08-23 ENCOUNTER — Other Ambulatory Visit: Payer: Self-pay

## 2019-08-23 VITALS — BP 140/82 | HR 65 | Temp 97.3°F | Ht 68.5 in | Wt 158.0 lb

## 2019-08-23 DIAGNOSIS — R55 Syncope and collapse: Secondary | ICD-10-CM | POA: Diagnosis not present

## 2019-08-23 DIAGNOSIS — I441 Atrioventricular block, second degree: Secondary | ICD-10-CM

## 2019-08-23 DIAGNOSIS — I10 Essential (primary) hypertension: Secondary | ICD-10-CM

## 2019-08-23 DIAGNOSIS — I483 Typical atrial flutter: Secondary | ICD-10-CM

## 2019-09-05 NOTE — Progress Notes (Signed)
Cardiology Office Note   Date:  09/07/2019   ID:  Scott Richards, DOB Jun 25, 1926, MRN [id number]  PCP:  Leeroy Cha, MD  Cardiologist:   Rykin Route Martinique, MD   Chief Complaint  Patient presents with  . Loss of Consciousness      History of Present Illness: Scott Richards is a 86 y.o. male who is seen for evaluation of syncope. He is a former patient of Dr [name]. He has a history of HTN and atrial flutter. He was seen in the ED in July 2017 with newly recognized Atrial flutter with 4:1 AV conduction. Rate 48 bpm. MRI brain showed no acute change. Seen by Dr. [name] and started on Xarelto. Echo showed normal LV function. Mild to moderate AI and moderate MR. Subsequently converted to NSR.   He was seen by me in February. At that time he was doing well.  He did note an occasional dropped beat on his BP monitor.  Ecg at that time showed NSR with second degree Mobitz type 1 AV block with HR 72. Since then he has experienced episodes of blacking out. Due to back problems he has to turn around when he is getting out of bed. The first episode occurred while getting out of bed and he blacked out suddenly without warning. Felt like he was floating over the bed. Only lasted about 15 seconds. Since then he has had 3-4 more episodes. He was seen by Primary care and Ecg showed atrial flutter with rate 53 bpm. A Zio patch monitor was placed. With monitor in place he has had some spells when he gets dizzy and felt  like he might faint but didn't. Event monitor reviewed today. In persistent Afib with HR range 28-144. Average 55. Longest pause 3.2 seconds.     Past Medical History:  Diagnosis Date  . Arrhythmia   . Atrial flutter ([place])   . Hypertension   . Vertigo     Past Surgical History:  Procedure Laterality Date  . ANKLE ARTHROSCOPY    . TONSILLECTOMY       Current Outpatient Medications  Medication Sig Dispense Refill  . cetirizine (ZYRTEC) 10 MG tablet Take 10 mg by mouth daily.     Marland Kitchen telmisartan (MICARDIS) 20 MG tablet Take 20 mg by mouth daily.     Alveda Reasons 15 MG TABS tablet Take 15 mg by mouth daily.     No current facility-administered medications for this visit.    Allergies:   Sulfa antibiotics    Social History:  The patient  reports that he has quit smoking. He has never used smokeless tobacco. He reports current alcohol use.   Family History:  The patient's family history includes Dementia in his mother.    ROS:  Please see the history of present illness.   Otherwise, review of systems are positive for none.   All other systems are reviewed and negative.    PHYSICAL EXAM: VS:  BP (!) 152/84   Pulse 70   Ht 5' 8.5" (1.74 m)   Wt 155 lb 12.8 oz (70.7 kg)   SpO2 96%   BMI 23.34 kg/m  , BMI Body mass index is 23.34 kg/m. GEN: Well nourished, well developed, in no acute distress  HEENT: normal  Neck: no JVD, carotid bruits, or masses Cardiac: IRRR; 1/6 systolic murmur RUSB to apex, no rubs, or gallops,no edema  Respiratory:  clear to auscultation bilaterally, normal work of breathing GI: soft, nontender, nondistended, + BS MS:  no deformity or atrophy  Skin: warm and dry, no rash Neuro:  Strength and sensation are intact Psych: euthymic mood, full affect   EKG:  EKG is not ordered today. The ekg done 08/14/19 reviewed Atrial flutter with rate 53. I have personally reviewed and interpreted this study.  Recent Labs: No results found for requested labs within last 8760 hours.    Lipid Panel No results found for: CHOL, TRIG, HDL, CHOLHDL, VLDL, LDLCALC, LDLDIRECT   Dated 11/24/17: cholesterol 179, triglycerides 87. HDL 53, LDL 109. Hgb normal Dated 05/25/18: normal BMET Dated 11/28/18: cholesterol 171, triglycerides 88, HDL 56, LDL 97. Creatinine 1.21. CBC and CMET otherwise normal. Dated 08/14/19: creatinine 1.29. otherwise CBC, CMET and TSH normal  Wt Readings from Last 3 Encounters:  09/07/19 155 lb 12.8 oz (70.7 kg)  08/23/19 158 lb (71.7  kg)  06/09/19 159 lb (72.1 kg)      Other studies Reviewed: Additional studies/ records that were reviewed today include: office records from Dr [name]. Review of the above records demonstrates: as noted above   ASSESSMENT AND PLAN:  1.  Atrial fibrillation. Persistent. Rate controlled on no AV nodal blockade. Concern is that he has very slow ventricular response at times. Usually this is at night but he had some slow rates at 7:30 am when he is usually up. Recent symptoms concerning with syncope.   On Xarelto for [place] Vasc score of 2. Recommend EP referral. Question is whether pacemaker is indicated or should we consider an ILR 2. HTN on Micardis. 3. Second degree AV block Mobitz type 1.  4. History of syncope. Episodes are brief and without warning strongly suggestive of arrhythmia. Suspect bradycardia or pause. See discussion above.  I have reviewed recommendation that he not drive.    Signed, [name], MD  09/07/2019 11:14 AM    [facility] [address], [place], [place], [address] Phone [phone], Fax [phone]

## 2019-09-07 ENCOUNTER — Encounter: Payer: Self-pay | Admitting: Cardiology

## 2019-09-07 ENCOUNTER — Ambulatory Visit: Payer: Medicare Other | Admitting: Cardiology

## 2019-09-07 ENCOUNTER — Other Ambulatory Visit: Payer: Self-pay

## 2019-09-07 VITALS — BP 152/84 | HR 70 | Ht 68.5 in | Wt 155.8 lb

## 2019-09-07 DIAGNOSIS — I10 Essential (primary) hypertension: Secondary | ICD-10-CM | POA: Diagnosis not present

## 2019-09-07 DIAGNOSIS — R55 Syncope and collapse: Secondary | ICD-10-CM

## 2019-09-07 DIAGNOSIS — I4819 Other persistent atrial fibrillation: Secondary | ICD-10-CM

## 2019-09-07 NOTE — Addendum Note (Signed)
Addended by: Kathyrn Lass on: 09/07/2019 11:21 AM   Modules accepted: Orders

## 2019-09-27 ENCOUNTER — Ambulatory Visit (INDEPENDENT_AMBULATORY_CARE_PROVIDER_SITE_OTHER): Payer: Medicare Other | Admitting: Internal Medicine

## 2019-09-27 ENCOUNTER — Other Ambulatory Visit: Payer: Self-pay

## 2019-09-27 ENCOUNTER — Encounter: Payer: Self-pay | Admitting: Internal Medicine

## 2019-09-27 DIAGNOSIS — R55 Syncope and collapse: Secondary | ICD-10-CM | POA: Insufficient documentation

## 2019-09-27 NOTE — Patient Instructions (Signed)
Medication Instructions:  Your physician recommends that you continue on your current medications as directed. Please refer to the Current Medication list given to you today.  Labwork: You will get lab work today:  BMP and CBC  Testing/Procedures: Your physician has recommended that you have a pacemaker inserted. A pacemaker is a small device that is placed under the skin of your chest or abdomen to help control abnormal heart rhythms. This device uses electrical pulses to prompt the heart to beat at a normal rate. Pacemakers are used to treat heart rhythms that are too slow. Wire (leads) are attached to the pacemaker that goes into the chambers of you heart. This is done in the hospital and usually requires and overnight stay. Please see the instruction sheet given to you today for more information.  Follow-Up:  See INSTRUCTION LETTER  Any Other Special Instructions Will Be Listed Below (If Applicable).  If you need a refill on your cardiac medications before your next appointment, please call your pharmacy.    Pacemaker Implantation, Adult Pacemaker implantation is a procedure to place a pacemaker inside your chest. A pacemaker is a small computer that sends electrical signals to the heart and helps your heart beat normally. A pacemaker also stores information about your heart rhythms. You may need pacemaker implantation if you:  Have a slow heartbeat (bradycardia).  Faint (syncope).  Have shortness of breath (dyspnea) due to heart problems. The pacemaker attaches to your heart through a wire, called a lead. Sometimes just one lead is needed. Other times, there will be two leads. There are two types of pacemakers:  Transvenous pacemaker. This type is placed under the skin or muscle of your chest. The lead goes through a vein in the chest area to reach the inside of the heart.  Epicardial pacemaker. This type is placed under the skin or muscle of your chest or belly. The lead goes  through your chest to the outside of the heart. Tell a health care provider about:  Any allergies you have.  All medicines you are taking, including vitamins, herbs, eye drops, creams, and over-the-counter medicines.  Any problems you or family members have had with anesthetic medicines.  Any blood or bone disorders you have.  Any surgeries you have had.  Any medical conditions you have.  Whether you are pregnant or may be pregnant. What are the risks? Generally, this is a safe procedure. However, problems may occur, including:  Infection.  Bleeding.  Failure of the pacemaker or the lead.  Collapse of a lung or bleeding into a lung.  Blood clot inside a blood vessel with a lead.  Damage to the heart.  Infection inside the heart (endocarditis).  Allergic reactions to medicines. What happens before the procedure? Staying hydrated Follow instructions from your health care provider about hydration, which may include:  Up to 2 hours before the procedure - you may continue to drink clear liquids, such as water, clear fruit juice, black coffee, and plain tea. Eating and drinking restrictions Follow instructions from your health care provider about eating and drinking, which may include:  8 hours before the procedure - stop eating heavy meals or foods such as meat, fried foods, or fatty foods.  6 hours before the procedure - stop eating light meals or foods, such as toast or cereal.  6 hours before the procedure - stop drinking milk or drinks that contain milk.  2 hours before the procedure - stop drinking clear liquids. Medicines  Ask  your health care provider about: ? Changing or stopping your regular medicines. This is especially important if you are taking diabetes medicines or blood thinners. ? Taking medicines such as aspirin and ibuprofen. These medicines can thin your blood. Do not take these medicines before your procedure if your health care provider instructs  you not to.  You may be given antibiotic medicine to help prevent infection. General instructions  You will have a heart evaluation. This may include an electrocardiogram (ECG), chest X-ray, and heart imaging (echocardiogram,  or echo) tests.  You will have blood tests.  Do not use any products that contain nicotine or tobacco, such as cigarettes and e-cigarettes. If you need help quitting, ask your health care provider.  Plan to have someone take you home from the hospital or clinic.  If you will be going home right after the procedure, plan to have someone with you for 24 hours.  Ask your health care provider how your surgical site will be marked or identified. What happens during the procedure?  To reduce your risk of infection: ? Your health care team will wash or sanitize their hands. ? Your skin will be washed with soap. ? Hair may be removed from the surgical area.  An IV tube will be inserted into one of your veins.  You will be given one or more of the following: ? A medicine to help you relax (sedative). ? A medicine to numb the area (local anesthetic). ? A medicine to make you fall asleep (general anesthetic).  If you are getting a transvenous pacemaker: ? An incision will be made in your upper chest. ? A pocket will be made for the pacemaker. It may be placed under the skin or between layers of muscle. ? The lead will be inserted into a blood vessel that returns to the heart. ? While X-rays are taken by an imaging machine (fluoroscopy), the lead will be advanced through the vein to the inside of your heart. ? The other end of the lead will be tunneled under the skin and attached to the pacemaker.  If you are getting an epicardial pacemaker: ? An incision will be made near your ribs or breastbone (sternum) for the lead. ? The lead will be attached to the outside of your heart. ? Another incision will be made in your chest or upper belly to create a pocket for the  pacemaker. ? The free end of the lead will be tunneled under the skin and attached to the pacemaker.  The transvenous or epicardial pacemaker will be tested. Imaging studies may be done to check the lead position.  The incisions will be closed with stitches (sutures), adhesive strips, or skin glue.  Bandages (dressing) will be placed over the incisions. The procedure may vary among health care providers and hospitals. What happens after the procedure?  Your blood pressure, heart rate, breathing rate, and blood oxygen level will be monitored until the medicines you were given have worn off.  You will be given antibiotics and pain medicine.  ECG and chest x-rays will be done.  You will wear a continuous type of ECG (Holter monitor) to check your heart rhythm.  Your health care provider will program the pacemaker.  Do not drive for 24 hours if you received a sedative. This information is not intended to replace advice given to you by your health care provider. Make sure you discuss any questions you have with your health care provider. Document Revised: 12/24/2017 Document  Reviewed: 09/18/2015 Elsevier Patient Education  [employer].

## 2019-09-27 NOTE — H&P (View-Only) (Signed)
HPI Scott Richards is referred by Dr. Martinique for evaluation of syncope. He is a pleasant 86 yo man who lives independently. He has HTN and a h/o atrial fib/flutter. He has significant AV conduction system disease with 4:1 AV block when he is in atypical atrial flutter and AVWB with 2:1 AV block. He notes frequent episodes of dizziness, especially when he moves his head to look up or to the side. He has had a single episode of frank syncope lasting seconds. He did not injure himself. On cardiac monitoring he has had daytime pauses of over 3 seconds. He denies chest pain or sob. Allergies  Allergen Reactions  . Sulfa Antibiotics      Current Outpatient Medications  Medication Sig Dispense Refill  . cetirizine (ZYRTEC) 10 MG tablet Take 10 mg by mouth daily.    Marland Kitchen telmisartan (MICARDIS) 20 MG tablet Take 20 mg by mouth daily.     Alveda Reasons 15 MG TABS tablet Take 15 mg by mouth daily.     No current facility-administered medications for this visit.     Past Medical History:  Diagnosis Date  . Arrhythmia   . Atrial flutter ([place])   . Hypertension   . Vertigo     ROS:   All systems reviewed and negative except as noted in the HPI.   Past Surgical History:  Procedure Laterality Date  . ANKLE ARTHROSCOPY    . TONSILLECTOMY       Family History  Problem Relation Age of Onset  . Dementia Mother      Social History   Socioeconomic History  . Marital status: Widowed    Spouse name: Not on file  . Number of children: 1  . Years of education: Not on file  . Highest education level: Not on file  Occupational History  . Occupation: retired- Art gallery manager  Tobacco Use  . Smoking status: Former Research scientist (life sciences)  . Smokeless tobacco: Never Used  Substance and Sexual Activity  . Alcohol use: Yes  . Drug use: Not on file  . Sexual activity: Not on file  Other Topics Concern  . Not on file  Social History Narrative  . Not on file   Social Determinants of Health    Financial Resource Strain:   . Difficulty of Paying Living Expenses:   Food Insecurity:   . Worried About Charity fundraiser in the Last Year:   . Arboriculturist in the Last Year:   Transportation Needs:   . Film/video editor (Medical):   Marland Kitchen Lack of Transportation (Non-Medical):   Physical Activity:   . Days of Exercise per Week:   . Minutes of Exercise per Session:   Stress:   . Feeling of Stress :   Social Connections:   . Frequency of Communication with Friends and Family:   . Frequency of Social Gatherings with Friends and Family:   . Attends Religious Services:   . Active Member of Clubs or Organizations:   . Attends Archivist Meetings:   Marland Kitchen Marital Status:   Intimate Partner Violence:   . Fear of Current or Ex-Partner:   . Emotionally Abused:   Marland Kitchen Physically Abused:   . Sexually Abused:      BP (!) 162/74   Pulse 65   Ht 5' 8.5" (1.74 m)   Wt 157 lb 12.8 oz (71.6 kg)   SpO2 98%   BMI 23.64 kg/m   Physical Exam:  Well appearing elderly man, NAD HEENT: Unremarkable Neck:  6 cm JVD, no thyromegally Lymphatics:  No adenopathy Back:  No CVA tenderness Lungs:  Clear with no wheezes HEART:  Regular brady rhythm, no murmurs, no rubs, no clicks Abd:  soft, positive bowel sounds, no organomegally, no rebound, no guarding Ext:  2 plus pulses, no edema, no cyanosis, no clubbing Skin:  No rashes no nodules Neuro:  CN II through XII intact, motor grossly intact  Assess/Plan: 1. Syncope/dizziness - his symptoms are consistent with a brady induced source. I have also cautioned that there are many causes of dizziness and he could undergo PPM insertion and still have dizziness.  2. AV block - he has fairly high grade AV block. He has no reversible causes and I will ask him to undergo PPM insertion. 3. Persistent atrial fib/flutter - we will decide on AA drug therapy after his PPM is in place and we can see how much he is out of rhythm.  Mikle Bosworth.D.

## 2019-09-27 NOTE — Progress Notes (Signed)
HPI [name] is referred by Dr. Martinique for evaluation of syncope. He is a pleasant 86 yo man who lives independently. He has HTN and a h/o atrial fib/flutter. He has significant AV conduction system disease with 4:1 AV block when he is in atypical atrial flutter and AVWB with 2:1 AV block. He notes frequent episodes of dizziness, especially when he moves his head to look up or to the side. He has had a single episode of frank syncope lasting seconds. He did not injure himself. On cardiac monitoring he has had daytime pauses of over 3 seconds. He denies chest pain or sob. Allergies  Allergen Reactions  . Sulfa Antibiotics      Current Outpatient Medications  Medication Sig Dispense Refill  . cetirizine (ZYRTEC) 10 MG tablet Take 10 mg by mouth daily.    Marland Kitchen telmisartan (MICARDIS) 20 MG tablet Take 20 mg by mouth daily.     Alveda Reasons 15 MG TABS tablet Take 15 mg by mouth daily.     No current facility-administered medications for this visit.     Past Medical History:  Diagnosis Date  . Arrhythmia   . Atrial flutter ([place])   . Hypertension   . Vertigo     ROS:   All systems reviewed and negative except as noted in the HPI.   Past Surgical History:  Procedure Laterality Date  . ANKLE ARTHROSCOPY    . TONSILLECTOMY       Family History  Problem Relation Age of Onset  . Dementia Mother      Social History   Socioeconomic History  . Marital status: Widowed    Spouse name: Not on file  . Number of children: 1  . Years of education: Not on file  . Highest education level: Not on file  Occupational History  . Occupation: retired- Art gallery manager  Tobacco Use  . Smoking status: Former Research scientist (life sciences)  . Smokeless tobacco: Never Used  Substance and Sexual Activity  . Alcohol use: Yes  . Drug use: Not on file  . Sexual activity: Not on file  Other Topics Concern  . Not on file  Social History Narrative  . Not on file   Social Determinants of Health    Financial Resource Strain:   . Difficulty of Paying Living Expenses:   Food Insecurity:   . Worried About Charity fundraiser in the Last Year:   . Arboriculturist in the Last Year:   Transportation Needs:   . Film/video editor (Medical):   Marland Kitchen Lack of Transportation (Non-Medical):   Physical Activity:   . Days of Exercise per Week:   . Minutes of Exercise per Session:   Stress:   . Feeling of Stress :   Social Connections:   . Frequency of Communication with Friends and Family:   . Frequency of Social Gatherings with Friends and Family:   . Attends Religious Services:   . Active Member of Clubs or Organizations:   . Attends Archivist Meetings:   Marland Kitchen Marital Status:   Intimate Partner Violence:   . Fear of Current or Ex-Partner:   . Emotionally Abused:   Marland Kitchen Physically Abused:   . Sexually Abused:      BP (!) 162/74   Pulse 65   Ht 5' 8.5" (1.74 m)   Wt 157 lb 12.8 oz (71.6 kg)   SpO2 98%   BMI 23.64 kg/m   Physical Exam:  Well appearing elderly man, NAD HEENT: Unremarkable Neck:  6 cm JVD, no thyromegally Lymphatics:  No adenopathy Back:  No CVA tenderness Lungs:  Clear with no wheezes HEART:  Regular brady rhythm, no murmurs, no rubs, no clicks Abd:  soft, positive bowel sounds, no organomegally, no rebound, no guarding Ext:  2 plus pulses, no edema, no cyanosis, no clubbing Skin:  No rashes no nodules Neuro:  CN II through XII intact, motor grossly intact  Assess/Plan: 1. Syncope/dizziness - his symptoms are consistent with a brady induced source. I have also cautioned that there are many causes of dizziness and he could undergo PPM insertion and still have dizziness.  2. AV block - he has fairly high grade AV block. He has no reversible causes and I will ask him to undergo PPM insertion. 3. Persistent atrial fib/flutter - we will decide on AA drug therapy after his PPM is in place and we can see how much he is out of rhythm.  Mikle Bosworth.D.

## 2019-09-28 LAB — CBC WITH DIFFERENTIAL/PLATELET
Basophils Absolute: 0.1 10*3/uL (ref 0.0–0.2)
Basos: 1 %
EOS (ABSOLUTE): 0.2 10*3/uL (ref 0.0–0.4)
Eos: 2 %
Hematocrit: 49.8 % (ref 37.5–51.0)
Hemoglobin: 16.8 g/dL (ref 13.0–17.7)
Immature Grans (Abs): 0.1 10*3/uL (ref 0.0–0.1)
Immature Granulocytes: 1 %
Lymphocytes Absolute: 2.1 10*3/uL (ref 0.7–3.1)
Lymphs: 24 %
MCH: 32.9 pg (ref 26.6–33.0)
MCHC: 33.7 g/dL (ref 31.5–35.7)
MCV: 98 fL — ABNORMAL HIGH (ref 79–97)
Monocytes Absolute: 0.6 10*3/uL (ref 0.1–0.9)
Monocytes: 7 %
Neutrophils Absolute: 5.8 10*3/uL (ref 1.4–7.0)
Neutrophils: 65 %
Platelets: 219 10*3/uL (ref 150–450)
RBC: 5.11 x10E6/uL (ref 4.14–5.80)
RDW: 12.2 % (ref 11.6–15.4)
WBC: 8.9 10*3/uL (ref 3.4–10.8)

## 2019-09-28 LAB — BASIC METABOLIC PANEL
BUN/Creatinine Ratio: 14 (ref 10–24)
BUN: 16 mg/dL (ref 10–36)
CO2: 24 mmol/L (ref 20–29)
Calcium: 9.3 mg/dL (ref 8.6–10.2)
Chloride: 102 mmol/L (ref 96–106)
Creatinine, Ser: 1.18 mg/dL (ref 0.76–1.27)
GFR calc Af Amer: 62 mL/min/{1.73_m2} (ref >59)
GFR calc non Af Amer: 54 mL/min/{1.73_m2} — ABNORMAL LOW (ref >59)
Glucose: 99 mg/dL (ref 65–99)
Potassium: 5 mmol/L (ref 3.5–5.2)
Sodium: 140 mmol/L (ref 134–144)

## 2019-10-16 ENCOUNTER — Ambulatory Visit (HOSPITAL_COMMUNITY)
Admission: RE | Admit: 2019-10-16 | Discharge: 2019-10-16 | Disposition: A | Discharge status: Home / Self Care | Payer: Medicare Other | Attending: Internal Medicine | Admitting: Internal Medicine

## 2019-10-16 ENCOUNTER — Other Ambulatory Visit: Payer: Self-pay

## 2019-10-16 ENCOUNTER — Ambulatory Visit (HOSPITAL_COMMUNITY)
Admission: RE | Disposition: A | Discharge status: Home / Self Care | Payer: Self-pay | Source: Home / Self Care | Attending: Internal Medicine

## 2019-10-16 ENCOUNTER — Ambulatory Visit (HOSPITAL_COMMUNITY): Payer: Medicare Other

## 2019-10-16 DIAGNOSIS — R55 Syncope and collapse: Secondary | ICD-10-CM | POA: Diagnosis not present

## 2019-10-16 DIAGNOSIS — Z87891 Personal history of nicotine dependence: Secondary | ICD-10-CM | POA: Diagnosis not present

## 2019-10-16 DIAGNOSIS — Z882 Allergy status to sulfonamides status: Secondary | ICD-10-CM | POA: Insufficient documentation

## 2019-10-16 DIAGNOSIS — I4891 Unspecified atrial fibrillation: Secondary | ICD-10-CM | POA: Diagnosis not present

## 2019-10-16 DIAGNOSIS — I484 Atypical atrial flutter: Secondary | ICD-10-CM | POA: Diagnosis not present

## 2019-10-16 DIAGNOSIS — Z7901 Long term (current) use of anticoagulants: Secondary | ICD-10-CM | POA: Diagnosis not present

## 2019-10-16 DIAGNOSIS — Z79899 Other long term (current) drug therapy: Secondary | ICD-10-CM | POA: Insufficient documentation

## 2019-10-16 DIAGNOSIS — I10 Essential (primary) hypertension: Secondary | ICD-10-CM | POA: Insufficient documentation

## 2019-10-16 DIAGNOSIS — I442 Atrioventricular block, complete: Secondary | ICD-10-CM | POA: Diagnosis not present

## 2019-10-16 DIAGNOSIS — Z950 Presence of cardiac pacemaker: Secondary | ICD-10-CM

## 2019-10-16 HISTORY — PX: PACEMAKER IMPLANT: EP1218

## 2019-10-16 SURGERY — PACEMAKER IMPLANT

## 2019-10-16 MED ORDER — IRBESARTAN 75 MG PO TABS
75.0000 mg | ORAL_TABLET | Freq: Every day | ORAL | Status: DC
  Administered 2019-10-16: 75 mg via ORAL
  Filled 2019-10-16: qty 1

## 2019-10-16 MED ORDER — HEPARIN (PORCINE) IN NACL 1000-0.9 UT/500ML-% IV SOLN
INTRAVENOUS | Status: AC
  Filled 2019-10-16: qty 500

## 2019-10-16 MED ORDER — SODIUM CHLORIDE 0.9 % IV SOLN
80.0000 mg | INTRAVENOUS | Status: AC
  Administered 2019-10-16: 80 mg
  Filled 2019-10-16: qty 2

## 2019-10-16 MED ORDER — SODIUM CHLORIDE 0.9 % IV SOLN: INTRAVENOUS | Status: DC

## 2019-10-16 MED ORDER — CHLORHEXIDINE GLUCONATE 4 % EX LIQD: 4.0000 "application " | Freq: Once | CUTANEOUS | Status: DC

## 2019-10-16 MED ORDER — HEPARIN (PORCINE) IN NACL 1000-0.9 UT/500ML-% IV SOLN
INTRAVENOUS | Status: DC | PRN
  Administered 2019-10-16 (×2): 500 mL

## 2019-10-16 MED ORDER — MIDAZOLAM HCL 5 MG/5ML IJ SOLN
INTRAMUSCULAR | Status: AC
  Filled 2019-10-16: qty 5

## 2019-10-16 MED ORDER — LIDOCAINE HCL (PF) 1 % IJ SOLN
INTRAMUSCULAR | Status: DC | PRN
  Administered 2019-10-16: 50 mL

## 2019-10-16 MED ORDER — ONDANSETRON HCL 4 MG/2ML IJ SOLN: 4.0000 mg | Freq: Four times a day (QID) | INTRAMUSCULAR | Status: DC | PRN

## 2019-10-16 MED ORDER — LIDOCAINE HCL 1 % IJ SOLN
INTRAMUSCULAR | Status: AC
  Filled 2019-10-16: qty 20

## 2019-10-16 MED ORDER — MIDAZOLAM HCL 5 MG/5ML IJ SOLN
INTRAMUSCULAR | Status: DC | PRN
  Administered 2019-10-16 (×2): 1 mg via INTRAVENOUS

## 2019-10-16 MED ORDER — ACETAMINOPHEN 325 MG PO TABS: 325.0000 mg | ORAL_TABLET | ORAL | Status: DC | PRN

## 2019-10-16 MED ORDER — SODIUM CHLORIDE 0.9 % IV SOLN
INTRAVENOUS | Status: AC
  Filled 2019-10-16: qty 2

## 2019-10-16 MED ORDER — CEFAZOLIN SODIUM-DEXTROSE 2-4 GM/100ML-% IV SOLN
INTRAVENOUS | Status: AC
  Filled 2019-10-16: qty 100

## 2019-10-16 MED ORDER — CEFAZOLIN SODIUM-DEXTROSE 1-4 GM/50ML-% IV SOLN: 1.0000 g | Freq: Four times a day (QID) | INTRAVENOUS | Status: DC

## 2019-10-16 MED ORDER — CEFAZOLIN SODIUM-DEXTROSE 2-4 GM/100ML-% IV SOLN
2.0000 g | INTRAVENOUS | Status: AC
  Administered 2019-10-16: 2 g via INTRAVENOUS

## 2019-10-16 MED ORDER — FENTANYL CITRATE (PF) 100 MCG/2ML IJ SOLN
INTRAMUSCULAR | Status: AC
  Filled 2019-10-16: qty 2

## 2019-10-16 MED ORDER — FENTANYL CITRATE (PF) 100 MCG/2ML IJ SOLN
INTRAMUSCULAR | Status: DC | PRN
  Administered 2019-10-16 (×2): 12.5 ug via INTRAVENOUS

## 2019-10-16 SURGICAL SUPPLY — 12 items

## 2019-10-16 NOTE — Discharge Instructions (Signed)
    Supplemental Discharge Instructions for  Pacemaker/Defibrillator Patients  Tomorrow, 10/17/2019, PLEASE SEND A REMOTE DEVICE TRANSMISSION    Activity No heavy lifting or vigorous activity with your left/right arm for 6 to 8 weeks.  Do not raise your left/right arm above your head for one week.  Gradually raise your affected arm as drawn below.              10/21/2019                  10/22/2019                 10/23/2019                 10/24/2019 __  NO DRIVING for  1 week ; you may begin driving on  06/20/2949 .  WOUND CARE - Keep the wound area clean and dry.  Do not get this area wet for one week. No showers for one week; you may shower on  10/24/2019   . - Tomorrow 11/16/19, remove the arm sling - Tomorrow, 11/16/19, remove the outer plastic bandage, there are steris strips (paper tapes) underneath covering the wound, DO NOT remove these - The tape/steri-strips on your wound will fall off; do not pull them off.  No bandage is needed on the site.  DO  NOT apply any creams, oils, or ointments to the wound area. - If you notice any drainage or discharge from the wound, any swelling or bruising at the site, or you develop a fever > 101? F after you are discharged home, call the office at once.  Special Instructions - You are still able to use cellular telephones; use the ear opposite the side where you have your pacemaker/defibrillator.  Avoid carrying your cellular phone near your device. - When traveling through airports, show security personnel your identification card to avoid being screened in the metal detectors.  Ask the security personnel to use the hand wand. - Avoid arc welding equipment, MRI testing (magnetic resonance imaging), TENS units (transcutaneous nerve stimulators).  Call the office for questions about other devices. - Avoid electrical appliances that are in poor condition or are not properly grounded. - Microwave ovens are safe to be near or to operate.

## 2019-10-16 NOTE — Progress Notes (Signed)
Discharge instructions reviewed with patient and left message with patients daughter. Report given to Adonis Huguenin, Therapist, sports.

## 2019-10-16 NOTE — Interval H&P Note (Signed)
History and Physical Interval Note:  10/16/2019 10:34 AM  Scott Richards  has presented today for surgery, with the diagnosis of syncope.  The various methods of treatment have been discussed with the patient and family. After consideration of risks, benefits and other options for treatment, the patient has consented to  Procedure(s): PACEMAKER IMPLANT (N/A) as a surgical intervention.  The patient's history has been reviewed, patient examined, no change in status, stable for surgery.  I have reviewed the patient's chart and labs.  Questions were answered to the patient's satisfaction.     Scott Richards

## 2019-10-17 ENCOUNTER — Encounter (HOSPITAL_COMMUNITY): Payer: Self-pay | Admitting: Internal Medicine

## 2019-10-17 MED FILL — Lidocaine HCl Local Inj 1%: INTRAMUSCULAR | Qty: 60 | Status: AC

## 2019-10-18 ENCOUNTER — Telehealth: Payer: Self-pay | Admitting: Emergency Medicine

## 2019-10-18 NOTE — Telephone Encounter (Signed)
Patient called to ask if he could take ibuprofen for pain at wound site. Patient had device implanted 10/16/19. Patient reports no edema at site , no drainage and only 4/10 pain at night. Patient advised to take Tylenol 650 mg every 6 hours for pain as needed. Questions answered.Remote monitor at bedside plugged in.

## 2019-10-26 ENCOUNTER — Ambulatory Visit (INDEPENDENT_AMBULATORY_CARE_PROVIDER_SITE_OTHER): Payer: Medicare Other | Admitting: Emergency Medicine

## 2019-10-26 ENCOUNTER — Other Ambulatory Visit: Payer: Self-pay

## 2019-10-26 DIAGNOSIS — R55 Syncope and collapse: Secondary | ICD-10-CM | POA: Diagnosis not present

## 2019-10-26 LAB — CUP PACEART INCLINIC DEVICE CHECK
Battery Remaining Longevity: 154 mo
Battery Voltage: 3.22 V
Brady Statistic RA Percent Paced: 0 %
Brady Statistic RV Percent Paced: 76.81 %
Date Time Interrogation Session: 20210708165447
Implantable Lead Implant Date: 20210628
Implantable Lead Implant Date: 20210628
Implantable Lead Location: 753859
Implantable Lead Location: 753860
Implantable Lead Model: 3830
Implantable Lead Model: 5076
Implantable Pulse Generator Implant Date: 20210628
Lead Channel Impedance Value: 361 Ohm
Lead Channel Impedance Value: 399 Ohm
Lead Channel Impedance Value: 532 Ohm
Lead Channel Impedance Value: 551 Ohm
Lead Channel Pacing Threshold Amplitude: 0.75 V
Lead Channel Pacing Threshold Pulse Width: 0.4 ms
Lead Channel Sensing Intrinsic Amplitude: 1.75 mV
Lead Channel Sensing Intrinsic Amplitude: 2.25 mV
Lead Channel Sensing Intrinsic Amplitude: 7.25 mV
Lead Channel Sensing Intrinsic Amplitude: 8.25 mV
Lead Channel Setting Pacing Amplitude: 3.5 V
Lead Channel Setting Pacing Amplitude: 3.5 V
Lead Channel Setting Pacing Pulse Width: 0.4 ms
Lead Channel Setting Sensing Sensitivity: 1.2 mV

## 2019-10-26 NOTE — Patient Instructions (Signed)
Monitor red area where steri-strips were removed, the area should improve, if worsens in any way call our office  [phone].

## 2019-10-26 NOTE — Progress Notes (Signed)
Wound check appointment. Steri-strips removed. Wound without redness or edema. Incision edges approximated, wound well healed. Normal device function. Thresholds, sensing, and impedances consistent with implant measurements.  Unable to test Ventricular threshold due to presence of AF.   Device programmed at 3.5V for extra safety margin until 3 month visit. Patient in Persistent AF since implant, known history of persistent AF on Xarelto for [place].  AF Burden 100%.  No ventricular arrhythmias noted. Patient educated about wound care, arm mobility, lifting restrictions. ROV on 01/19/20 with Dr. Lovena Le.  Patient transmitting remotely, next scheduled check on 01/15/20.

## 2019-12-07 NOTE — Progress Notes (Signed)
Cardiology Office Note   Date:  12/11/2019   ID:  Scott, Richards [birth year], MRN [id number]  PCP:  Leeroy Cha, MD  Cardiologist:   Graci Hulce Martinique, MD   Chief Complaint  Patient presents with  . Palpitations      History of Present Illness: Scott Richards is a 86 y.o. male who is seen for follow up Afib and bradycardia. He is a former patient of Dr [name]. He has a history of HTN and atrial flutter. He was seen in the ED in July 2017 with newly recognized Atrial flutter with 4:1 AV conduction. Rate 48 bpm. MRI brain showed no acute change. Seen by Dr. [name] and started on Xarelto. Echo showed normal LV function. Mild to moderate AI and moderate MR. Subsequently converted to NSR.   He was seen by me in February. At that time he was doing well.  He did note an occasional dropped beat on his BP monitor.  Ecg at that time showed NSR with second degree Mobitz type 1 AV block with HR 72. When seen in May he had experienced episodes of blacking out. Due to back problems he has to turn around when he is getting out of bed. The first episode occurred while getting out of bed and he blacked out suddenly without warning. Felt like he was floating over the bed. Only lasted about 15 seconds. Since then he has had 3-4 more episodes. He was seen by Primary care and Ecg showed atrial flutter with rate 53 bpm. A Zio patch monitor was placed. With monitor in place he has had some spells when he gets dizzy and felt  like he might faint but didn't. Event monitor reviewed today. In persistent Afib with HR range 28-144. Average 55. Longest pause 3.2 seconds.   He was seen by Dr Lovena Le and underwent pacemaker implant on 10/16/19 with dual chamber device.   Since he had pacemaker in place he can't really notice any change in symptoms. No recurrent near syncope. Able to do all his own yard and house work. Feels fatigued in am.     Past Medical History:  Diagnosis Date  . Arrhythmia   . Atrial  flutter ([place])   . Hypertension   . Vertigo     Past Surgical History:  Procedure Laterality Date  . ANKLE ARTHROSCOPY    . PACEMAKER IMPLANT N/A 10/16/2019   Procedure: PACEMAKER IMPLANT;  Surgeon: Evans Lance, MD;  Location: [facility];  Service: Cardiovascular;  Laterality: N/A;  . TONSILLECTOMY       Current Outpatient Medications  Medication Sig Dispense Refill  . cetirizine (ZYRTEC) 10 MG tablet Take 10 mg by mouth daily.    Marland Kitchen telmisartan (MICARDIS) 20 MG tablet Take 20 mg by mouth daily.     Alveda Reasons 15 MG TABS tablet Take 15 mg by mouth daily.     No current facility-administered medications for this visit.    Allergies:   Sulfa antibiotics    Social History:  The patient  reports that he has quit smoking. He has never used smokeless tobacco. He reports current alcohol use.   Family History:  The patient's family history includes Dementia in his mother.    ROS:  Please see the history of present illness.   Otherwise, review of systems are positive for none.   All other systems are reviewed and negative.    PHYSICAL EXAM: VS:  BP (!) 150/86   Pulse 81  Ht 5' 8.5" (1.74 m)   Wt 159 lb (72.1 kg)   SpO2 98%   BMI 23.82 kg/m  , BMI Body mass index is 23.82 kg/m. GEN: Well nourished, well developed, in no acute distress  HEENT: normal  Neck: no JVD, carotid bruits, or masses Cardiac: IRRR; 1/6 systolic murmur RUSB to apex, no rubs, or gallops,no edema. Pacemaker site has healed well.  Respiratory:  clear to auscultation bilaterally, normal work of breathing GI: soft, nontender, nondistended, + BS MS: no deformity or atrophy  Skin: warm and dry, no rash Neuro:  Strength and sensation are intact Psych: euthymic mood, full affect   EKG:  EKG is not ordered today.  Recent Labs: 09/27/2019: BUN 16; Creatinine, Ser 1.18; Hemoglobin 16.8; Platelets 219; Potassium 5.0; Sodium 140    Lipid Panel No results found for: CHOL, TRIG, HDL, CHOLHDL, VLDL,  LDLCALC, LDLDIRECT   Dated 11/24/17: cholesterol 179, triglycerides 87. HDL 53, LDL 109. Hgb normal Dated 05/25/18: normal BMET Dated 11/28/18: cholesterol 171, triglycerides 88, HDL 56, LDL 97. Creatinine 1.21. CBC and CMET otherwise normal. Dated 08/14/19: creatinine 1.29. otherwise CBC, CMET and TSH normal Dated 12/04/19: creatinine 1.3. potassium 5.4. otherwise CMET normal. CBC normal.    Wt Readings from Last 3 Encounters:  12/11/19 159 lb (72.1 kg)  10/16/19 160 lb (72.6 kg)  09/27/19 157 lb 12.8 oz (71.6 kg)      Other studies Reviewed: None    ASSESSMENT AND PLAN:  1.  Atrial fibrillation. Persistent. Rate controlled on no AV nodal blockade. He has very slow ventricular response at times and was symptomatic. Now s/p dual chamber pacemaker on 10/16/19.   On Xarelto for [place] Vasc score of 2.  2. HTN on Micardis. Well controlled.  3. Second degree AV block Mobitz type 1.  4. History of syncope. Now s/p pacemaker.    Follow up in one year  Signed, Deepti Gunawan Martinique, MD  12/11/2019 11:37 AM    [facility] [address], [place], [place], [address] Phone [phone], Fax [phone]

## 2019-12-11 ENCOUNTER — Ambulatory Visit: Payer: Medicare Other | Admitting: Cardiology

## 2019-12-11 ENCOUNTER — Other Ambulatory Visit: Payer: Self-pay

## 2019-12-11 ENCOUNTER — Encounter: Payer: Self-pay | Admitting: Cardiology

## 2019-12-11 VITALS — BP 150/86 | HR 81 | Ht 68.5 in | Wt 159.0 lb

## 2019-12-11 DIAGNOSIS — I441 Atrioventricular block, second degree: Secondary | ICD-10-CM

## 2019-12-11 DIAGNOSIS — R55 Syncope and collapse: Secondary | ICD-10-CM | POA: Diagnosis not present

## 2019-12-11 DIAGNOSIS — Z950 Presence of cardiac pacemaker: Secondary | ICD-10-CM | POA: Diagnosis not present

## 2019-12-11 DIAGNOSIS — I4819 Other persistent atrial fibrillation: Secondary | ICD-10-CM | POA: Diagnosis not present

## 2020-01-15 ENCOUNTER — Ambulatory Visit (INDEPENDENT_AMBULATORY_CARE_PROVIDER_SITE_OTHER): Payer: Medicare Other | Admitting: Emergency Medicine

## 2020-01-15 DIAGNOSIS — I441 Atrioventricular block, second degree: Secondary | ICD-10-CM

## 2020-01-15 LAB — CUP PACEART REMOTE DEVICE CHECK
Battery Remaining Longevity: 128 mo
Battery Voltage: 3.2 V
Brady Statistic RA Percent Paced: 0.02 %
Brady Statistic RV Percent Paced: 86.89 %
Date Time Interrogation Session: 20210926221955
Implantable Lead Implant Date: 20210628
Implantable Lead Implant Date: 20210628
Implantable Lead Location: 753859
Implantable Lead Location: 753860
Implantable Lead Model: 3830
Implantable Lead Model: 5076
Implantable Pulse Generator Implant Date: 20210628
Lead Channel Impedance Value: 361 Ohm
Lead Channel Impedance Value: 437 Ohm
Lead Channel Impedance Value: 532 Ohm
Lead Channel Impedance Value: 589 Ohm
Lead Channel Pacing Threshold Amplitude: 0.75 V
Lead Channel Pacing Threshold Pulse Width: 0.4 ms
Lead Channel Sensing Intrinsic Amplitude: 1.625 mV
Lead Channel Sensing Intrinsic Amplitude: 1.625 mV
Lead Channel Sensing Intrinsic Amplitude: 13.75 mV
Lead Channel Sensing Intrinsic Amplitude: 13.75 mV
Lead Channel Setting Pacing Amplitude: 3.5 V
Lead Channel Setting Pacing Amplitude: 3.5 V
Lead Channel Setting Pacing Pulse Width: 0.4 ms
Lead Channel Setting Sensing Sensitivity: 1.2 mV

## 2020-01-17 NOTE — Progress Notes (Signed)
Remote pacemaker transmission.   

## 2020-01-19 ENCOUNTER — Encounter: Payer: Self-pay | Admitting: Internal Medicine

## 2020-01-19 ENCOUNTER — Ambulatory Visit (INDEPENDENT_AMBULATORY_CARE_PROVIDER_SITE_OTHER): Payer: Medicare Other | Admitting: Internal Medicine

## 2020-01-19 ENCOUNTER — Other Ambulatory Visit: Payer: Self-pay

## 2020-01-19 VITALS — BP 142/72 | HR 80 | Ht 68.5 in | Wt 157.0 lb

## 2020-01-19 DIAGNOSIS — I459 Conduction disorder, unspecified: Secondary | ICD-10-CM

## 2020-01-19 DIAGNOSIS — I480 Paroxysmal atrial fibrillation: Secondary | ICD-10-CM | POA: Diagnosis not present

## 2020-01-19 DIAGNOSIS — Z950 Presence of cardiac pacemaker: Secondary | ICD-10-CM | POA: Insufficient documentation

## 2020-01-19 DIAGNOSIS — R55 Syncope and collapse: Secondary | ICD-10-CM

## 2020-01-19 NOTE — Patient Instructions (Signed)
Medication Instructions:  Your physician recommends that you continue on your current medications as directed. Please refer to the Current Medication list given to you today.  Labwork: None ordered.  Testing/Procedures: None ordered.  Follow-Up: Your physician wants you to follow-up in: one year with Dr. Lovena Le.   You will receive a reminder letter in the mail two months in advance. If you don't receive a letter, please call our office to schedule the follow-up appointment.  Remote monitoring is used to monitor your Pacemaker from home. This monitoring reduces the number of office visits required to check your device to one time per year. It allows [place] to keep an eye on the functioning of your device to ensure it is working properly. You are scheduled for a device check from home on 04/15/2020. You may send your transmission at any time that day. If you have a wireless device, the transmission will be sent automatically. After your physician reviews your transmission, you will receive a postcard with your next transmission date.  Any Other Special Instructions Will Be Listed Below (If Applicable).  If you need a refill on your cardiac medications before your next appointment, please call your pharmacy.

## 2020-01-19 NOTE — Progress Notes (Signed)
HPI [name] returns today for followup. He is a pleasant 86 yo man with a h/o HTN, high grade/complete heart block, s/p PPM insertion. He has done well since his PPM was inserted. He has not had any dizzy spells and remains active. He has developed atypical atrial flutter and has been placed on xarelto. He denies palpitations.  Allergies  Allergen Reactions  . Sulfa Antibiotics     Pt can't recall reaction     Current Outpatient Medications  Medication Sig Dispense Refill  . cetirizine (ZYRTEC) 10 MG tablet Take 10 mg by mouth daily.    Marland Kitchen telmisartan (MICARDIS) 20 MG tablet Take 20 mg by mouth daily.     Alveda Reasons 15 MG TABS tablet Take 15 mg by mouth daily.     No current facility-administered medications for this visit.     Past Medical History:  Diagnosis Date  . Arrhythmia   . Atrial flutter ([place])   . Hypertension   . Vertigo     ROS:   All systems reviewed and negative except as noted in the HPI.   Past Surgical History:  Procedure Laterality Date  . ANKLE ARTHROSCOPY    . PACEMAKER IMPLANT N/A 10/16/2019   Procedure: PACEMAKER IMPLANT;  Surgeon: Scott Lance, MD;  Location: [facility];  Service: Cardiovascular;  Laterality: N/A;  . TONSILLECTOMY       Family History  Problem Relation Age of Onset  . Dementia Mother      Social History   Socioeconomic History  . Marital status: Widowed    Spouse name: Not on file  . Number of children: 1  . Years of education: Not on file  . Highest education level: Not on file  Occupational History  . Occupation: retired- Art gallery manager  Tobacco Use  . Smoking status: Former Research scientist (life sciences)  . Smokeless tobacco: Never Used  Substance and Sexual Activity  . Alcohol use: Yes  . Drug use: Not on file  . Sexual activity: Not on file  Other Topics Concern  . Not on file  Social History Narrative  . Not on file   Social Determinants of Health   Financial Resource Strain:   . Difficulty of  Paying Living Expenses: Not on file  Food Insecurity:   . Worried About Charity fundraiser in the Last Year: Not on file  . Ran Out of Food in the Last Year: Not on file  Transportation Needs:   . Lack of Transportation (Medical): Not on file  . Lack of Transportation (Non-Medical): Not on file  Physical Activity:   . Days of Exercise per Week: Not on file  . Minutes of Exercise per Session: Not on file  Stress:   . Feeling of Stress : Not on file  Social Connections:   . Frequency of Communication with Friends and Family: Not on file  . Frequency of Social Gatherings with Friends and Family: Not on file  . Attends Religious Services: Not on file  . Active Member of Clubs or Organizations: Not on file  . Attends Archivist Meetings: Not on file  . Marital Status: Not on file  Intimate Partner Violence:   . Fear of Current or Ex-Partner: Not on file  . Emotionally Abused: Not on file  . Physically Abused: Not on file  . Sexually Abused: Not on file     BP (!) 142/72   Pulse 80   Ht 5' 8.5" (  1.74 m)   Wt 157 lb (71.2 kg)   SpO2 97%   BMI 23.52 kg/m   Physical Exam:  Well appearing NAD HEENT: Unremarkable Neck:  No JVD, no thyromegally Lymphatics:  No adenopathy Back:  No CVA tenderness Lungs:  Clear with no wheezes HEART:  Regular rate rhythm, no murmurs, no rubs, no clicks Abd:  soft, positive bowel sounds, no organomegally, no rebound, no guarding Ext:  2 plus pulses, no edema, no cyanosis, no clubbing Skin:  No rashes no nodules Neuro:  CN II through XII intact, motor grossly intact  EKG - atypical atrial flutter  DEVICE  Normal device function.  See PaceArt for details.   Assess/Plan: 1. Atypical atrial flutter/fib - the patient has not had any symptoms despite going out of rhythm after his PPM insertion. He will need to continue systemic anti-coagulation indefinitely.  2. HTN - his sbp is minimally increased. He will continue to take his current  meds. He will need to keep a check on his bp at home.  3. PPM - his medtronic DDD PM is working normally. We will recheck in several months.  Scott Overlie [name],MD

## 2020-01-22 NOTE — Addendum Note (Signed)
Addended by: Alek Poncedeleon H on: 01/22/2020 01:43 PM   Modules accepted: Orders  

## 2020-04-15 ENCOUNTER — Ambulatory Visit (INDEPENDENT_AMBULATORY_CARE_PROVIDER_SITE_OTHER): Payer: Medicare Other

## 2020-04-15 DIAGNOSIS — I459 Conduction disorder, unspecified: Secondary | ICD-10-CM | POA: Diagnosis not present

## 2020-04-15 LAB — CUP PACEART REMOTE DEVICE CHECK
Battery Remaining Longevity: 144 mo
Battery Voltage: 3.16 V
Brady Statistic AP VP Percent: 34.02 %
Brady Statistic AP VS Percent: 0.02 %
Brady Statistic AS VP Percent: 61.12 %
Brady Statistic AS VS Percent: 4.88 %
Brady Statistic RA Percent Paced: 8.54 %
Brady Statistic RV Percent Paced: 87.76 %
Date Time Interrogation Session: 20211226222210
Implantable Lead Implant Date: 20210628
Implantable Lead Implant Date: 20210628
Implantable Lead Location: 753859
Implantable Lead Location: 753860
Implantable Lead Model: 3830
Implantable Lead Model: 5076
Implantable Pulse Generator Implant Date: 20210628
Lead Channel Impedance Value: 323 Ohm
Lead Channel Impedance Value: 399 Ohm
Lead Channel Impedance Value: 475 Ohm
Lead Channel Impedance Value: 551 Ohm
Lead Channel Pacing Threshold Amplitude: 0.625 V
Lead Channel Pacing Threshold Amplitude: 0.875 V
Lead Channel Pacing Threshold Pulse Width: 0.4 ms
Lead Channel Pacing Threshold Pulse Width: 0.4 ms
Lead Channel Sensing Intrinsic Amplitude: 14.125 mV
Lead Channel Sensing Intrinsic Amplitude: 14.125 mV
Lead Channel Sensing Intrinsic Amplitude: 3.125 mV
Lead Channel Sensing Intrinsic Amplitude: 3.125 mV
Lead Channel Setting Pacing Amplitude: 2.5 V
Lead Channel Setting Pacing Amplitude: 3 V
Lead Channel Setting Pacing Pulse Width: 0.4 ms
Lead Channel Setting Sensing Sensitivity: 1.2 mV

## 2020-04-25 NOTE — Progress Notes (Signed)
Remote pacemaker transmission.   

## 2020-06-05 DIAGNOSIS — B370 Candidal stomatitis: Secondary | ICD-10-CM | POA: Diagnosis not present

## 2020-06-05 DIAGNOSIS — I10 Essential (primary) hypertension: Secondary | ICD-10-CM | POA: Diagnosis not present

## 2020-06-05 DIAGNOSIS — I4892 Unspecified atrial flutter: Secondary | ICD-10-CM | POA: Diagnosis not present

## 2020-07-14 LAB — CUP PACEART REMOTE DEVICE CHECK
Battery Remaining Longevity: 140 mo
Battery Voltage: 3.1 V
Brady Statistic AP VP Percent: 16.7 %
Brady Statistic AP VS Percent: 0.03 %
Brady Statistic AS VP Percent: 82.39 %
Brady Statistic AS VS Percent: 0.95 %
Brady Statistic RA Percent Paced: 14.5 %
Brady Statistic RV Percent Paced: 90.2 %
Date Time Interrogation Session: 20220326230332
Implantable Lead Implant Date: 20210628
Implantable Lead Implant Date: 20210628
Implantable Lead Location: 753859
Implantable Lead Location: 753860
Implantable Lead Model: 3830
Implantable Lead Model: 5076
Implantable Pulse Generator Implant Date: 20210628
Lead Channel Impedance Value: 342 Ohm
Lead Channel Impedance Value: 399 Ohm
Lead Channel Impedance Value: 494 Ohm
Lead Channel Impedance Value: 551 Ohm
Lead Channel Pacing Threshold Amplitude: 0.625 V
Lead Channel Pacing Threshold Amplitude: 1 V
Lead Channel Pacing Threshold Pulse Width: 0.4 ms
Lead Channel Pacing Threshold Pulse Width: 0.4 ms
Lead Channel Sensing Intrinsic Amplitude: 1.625 mV
Lead Channel Sensing Intrinsic Amplitude: 1.625 mV
Lead Channel Sensing Intrinsic Amplitude: 13.75 mV
Lead Channel Sensing Intrinsic Amplitude: 13.75 mV
Lead Channel Setting Pacing Amplitude: 2.5 V
Lead Channel Setting Pacing Amplitude: 3 V
Lead Channel Setting Pacing Pulse Width: 0.4 ms
Lead Channel Setting Sensing Sensitivity: 1.2 mV

## 2020-07-15 ENCOUNTER — Ambulatory Visit (INDEPENDENT_AMBULATORY_CARE_PROVIDER_SITE_OTHER): Payer: Medicare Other

## 2020-07-15 DIAGNOSIS — I459 Conduction disorder, unspecified: Secondary | ICD-10-CM

## 2020-07-24 NOTE — Progress Notes (Signed)
Remote pacemaker transmission.   

## 2020-10-14 ENCOUNTER — Ambulatory Visit (INDEPENDENT_AMBULATORY_CARE_PROVIDER_SITE_OTHER): Payer: Medicare Other

## 2020-10-14 DIAGNOSIS — I459 Conduction disorder, unspecified: Secondary | ICD-10-CM | POA: Diagnosis not present

## 2020-10-14 LAB — CUP PACEART REMOTE DEVICE CHECK
Battery Remaining Longevity: 135 mo
Battery Voltage: 3.04 V
Brady Statistic AP VP Percent: 17.04 %
Brady Statistic AP VS Percent: 0.05 %
Brady Statistic AS VP Percent: 81.93 %
Brady Statistic AS VS Percent: 1.05 %
Brady Statistic RA Percent Paced: 15.18 %
Brady Statistic RV Percent Paced: 89.31 %
Date Time Interrogation Session: 20220627063141
Implantable Lead Implant Date: 20210628
Implantable Lead Implant Date: 20210628
Implantable Lead Location: 753859
Implantable Lead Location: 753860
Implantable Lead Model: 3830
Implantable Lead Model: 5076
Implantable Pulse Generator Implant Date: 20210628
Lead Channel Impedance Value: 304 Ohm
Lead Channel Impedance Value: 380 Ohm
Lead Channel Impedance Value: 456 Ohm
Lead Channel Impedance Value: 532 Ohm
Lead Channel Pacing Threshold Amplitude: 0.625 V
Lead Channel Pacing Threshold Amplitude: 1.125 V
Lead Channel Pacing Threshold Pulse Width: 0.4 ms
Lead Channel Pacing Threshold Pulse Width: 0.4 ms
Lead Channel Sensing Intrinsic Amplitude: 1.5 mV
Lead Channel Sensing Intrinsic Amplitude: 1.5 mV
Lead Channel Sensing Intrinsic Amplitude: 11.625 mV
Lead Channel Sensing Intrinsic Amplitude: 11.625 mV
Lead Channel Setting Pacing Amplitude: 2.5 V
Lead Channel Setting Pacing Amplitude: 3 V
Lead Channel Setting Pacing Pulse Width: 0.4 ms
Lead Channel Setting Sensing Sensitivity: 1.2 mV

## 2020-10-30 NOTE — Progress Notes (Signed)
Remote pacemaker transmission.   

## 2020-12-02 DIAGNOSIS — R634 Abnormal weight loss: Secondary | ICD-10-CM | POA: Diagnosis not present

## 2020-12-02 DIAGNOSIS — N182 Chronic kidney disease, stage 2 (mild): Secondary | ICD-10-CM | POA: Diagnosis not present

## 2020-12-02 DIAGNOSIS — I4892 Unspecified atrial flutter: Secondary | ICD-10-CM | POA: Diagnosis not present

## 2020-12-02 DIAGNOSIS — D6859 Other primary thrombophilia: Secondary | ICD-10-CM | POA: Diagnosis not present

## 2020-12-02 DIAGNOSIS — I10 Essential (primary) hypertension: Secondary | ICD-10-CM | POA: Diagnosis not present

## 2020-12-02 DIAGNOSIS — R413 Other amnesia: Secondary | ICD-10-CM | POA: Diagnosis not present

## 2020-12-02 DIAGNOSIS — R432 Parageusia: Secondary | ICD-10-CM | POA: Diagnosis not present

## 2020-12-09 NOTE — Progress Notes (Signed)
Cardiology Office Note   Date:  12/13/2020   ID:  Scott, Richards Jul 05, 1926, MRN [id number]  PCP:  Leeroy Cha, MD  Cardiologist:  Peter Martinique, MD EP: None  Chief Complaint  Patient presents with   Follow-up    Paroxysmal atrial fibrillation, HTN       History of Present Illness: Scott Richards is a 87 y.o. male with a PMH of paroxysmal atrial fibrillation/flutter, CHB s/p PPM 09/2019, HTN, and vertigo, who presents for annual follow-up.  He saw Dr. Martinique 11/2019 at which time he was doing fine from a cardiac standpoint with complaints of AM fatigue but otherwise continue to do all his own yard/house work. No medication changes occurred and he was recommended to follow-up in 1 year. He saw Dr. Lovena Le 01/2020 for post-PPM placement follow-up and was without recurrent dizzy spells. His last device interrogation 09/2020 showed normal device function and 46% Afib burden. His last echocardiogram in 2017 showed EF 55%, mild-moderate AI/TR, moderate MR, and mild PR.   He presents today for annual follow-up with his daughter. He has been doing pretty well from a cardiac standpoint. He continues to live alone and is able to perform ADLs, household chores, and yard work without much limitation. He has noticed some DOE in the past several months which has not progressed significantly since onset. He reports rare episode of left sided chest pains which occur without rhyme or reasons and are fleeting, lasting for seconds at a time. He describes some orthopnea which resolves within several minutes of laying down but no PND or LE edema. He also has vertigo which is classically positional in nature but thankfully no falls. He denies exertional chest pain, SOB at rest, palpitations, lightheadedness, or syncope. He is compliant with his xarelto and has some bruising of his arms but otherwise no complaints of bleeding. He reports his legs are constantly cold but has no complaints of  claudication, rubor on dependency, or poorly healing wounds on his legs/ankles.   Past Medical History:  Diagnosis Date   Arrhythmia    Atrial flutter ([place])    Hypertension    Vertigo     Past Surgical History:  Procedure Laterality Date   ANKLE ARTHROSCOPY     PACEMAKER IMPLANT N/A 10/16/2019   Procedure: PACEMAKER IMPLANT;  Surgeon: Evans Lance, MD;  Location: [facility];  Service: Cardiovascular;  Laterality: N/A;   TONSILLECTOMY       Current Outpatient Medications  Medication Sig Dispense Refill   telmisartan (MICARDIS) 20 MG tablet Take 20 mg by mouth daily.      XARELTO 15 MG TABS tablet Take 15 mg by mouth daily.     No current facility-administered medications for this visit.    Allergies:   Sulfa antibiotics    Social History:  The patient  reports that he has quit smoking. He has never used smokeless tobacco. He reports current alcohol use.   Family History:  The patient's family history includes Dementia in his mother.    ROS:  Please see the history of present illness.   Otherwise, review of systems are positive for none.   All other systems are reviewed and negative.    PHYSICAL EXAM: VS:  BP (!) 149/81   Pulse 80   Ht 5' 8.5" (1.74 m)   Wt 153 lb 3.2 oz (69.5 kg)   SpO2 97%   BMI 22.96 kg/m  , BMI Body mass index is 22.96 kg/m.  GEN: Well nourished, well developed, in no acute distress; appears younger than stated age 89: sclera anicteric Neck: no JVD, carotid bruits, or masses Cardiac: RRR; + murmur, no rubs or gallops,no edema  Respiratory:  clear to auscultation bilaterally, normal work of breathing GI: soft, nontender, nondistended, + BS MS: no deformity or atrophy Skin: warm and dry, no rash Neuro:  Strength and sensation are intact Psych: euthymic mood, full affect   EKG:  EKG is ordered today. The ekg ordered today demonstrates V-paced rhythm with rate 80 bpm; no change from previous   Recent Labs: No results found  for requested labs within last 8760 hours.    Lipid Panel No results found for: CHOL, TRIG, HDL, CHOLHDL, VLDL, LDLCALC, LDLDIRECT    Wt Readings from Last 3 Encounters:  12/13/20 153 lb 3.2 oz (69.5 kg)  01/19/20 157 lb (71.2 kg)  12/11/19 159 lb (72.1 kg)      Other studies Reviewed: Additional studies/ records that were reviewed today include:   Scanned echocardiogram from 2017 reviewed    ASSESSMENT AND PLAN:  1. Paroxysmal atrial fibrillation: 46% burden on last device interrogation 09/2020. Remains relatively asymptomatic  - Continue xarelto for stroke ppx  2. HTN: BP 149/81 today.  - Recommended BP log at home to determine if medication changes need to occur. He will notify the office if BP is consistently >130/80 over the next 1-2 weeks - Continue telmisartan - Encourage low salt diet  3. CHB s/p PPM 09/2019: device functioning appropriately on last check 09/2020 - Continue routine outpatient monitoring  - Continue follow-up with Dr. Lovena Le  4. Valvulopathy: mild-moderate AI/TR, moderate MR, and mild PR noted on last echo from 2017. In the past several months he has noticed DOE with yard work, as well as some mild orthopnea but no LE edema or exertional chest pain.  - Will update an echocardiogram at this time - could consider ischemic testing if there is a change in EF or wall motion present on echo  - May be a candidate for mitra clip if MR has worsened   Current medicines are reviewed at length with the patient today.  The patient does not have concerns regarding medicines.  The following changes have been made:  As above  Labs/ tests ordered today include:   Orders Placed This Encounter  Procedures   EKG 12-Lead   ECHOCARDIOGRAM COMPLETE      Disposition:   FU with Dr. Martinique in 6 months  Signed, Abigail Butts, PA-C  12/13/2020 11:30 AM

## 2020-12-13 ENCOUNTER — Other Ambulatory Visit: Payer: Self-pay

## 2020-12-13 ENCOUNTER — Ambulatory Visit: Payer: Medicare Other | Admitting: Medical

## 2020-12-13 ENCOUNTER — Encounter: Payer: Self-pay | Admitting: Medical

## 2020-12-13 VITALS — BP 149/81 | HR 80 | Ht 68.5 in | Wt 153.2 lb

## 2020-12-13 DIAGNOSIS — R0602 Shortness of breath: Secondary | ICD-10-CM | POA: Diagnosis not present

## 2020-12-13 DIAGNOSIS — I480 Paroxysmal atrial fibrillation: Secondary | ICD-10-CM | POA: Diagnosis not present

## 2020-12-13 DIAGNOSIS — I10 Essential (primary) hypertension: Secondary | ICD-10-CM

## 2020-12-13 DIAGNOSIS — Z950 Presence of cardiac pacemaker: Secondary | ICD-10-CM | POA: Diagnosis not present

## 2020-12-13 NOTE — Patient Instructions (Signed)
Medication Instructions:  Your physician recommends that you continue on your current medications as directed. Please refer to the Current Medication list given to you today.  *If you need a refill on your cardiac medications before your next appointment, please call your pharmacy*   Lab Work: None If you have labs (blood work) drawn today and your tests are completely normal, you will receive your results only by: [place] (if you have MyChart) OR A paper copy in the mail If you have any lab test that is abnormal or we need to change your treatment, we will call you to review the results.   Testing/Procedures: Your physician has requested that you have an echocardiogram. Echocardiography is a painless test that uses sound waves to create images of your heart. It provides your doctor with information about the size and shape of your heart and how well your heart's chambers and valves are working. This procedure takes approximately one hour. There are no restrictions for this procedure.    Follow-Up: At [facility], you and your health needs are our priority.  As part of our continuing mission to provide you with exceptional heart care, we have created designated Provider Care Teams.  These Care Teams include your primary Cardiologist (physician) and Advanced Practice Providers (APPs -  Physician Assistants and Nurse Practitioners) who all work together to provide you with the care you need, when you need it.   Your next appointment:   6 month(s)  The format for your next appointment:   In Person  Provider:   Peter Martinique, MD   Other Instructions Log your blood pressure readings and call us if the readings are consistently greater that 130/80

## 2021-01-03 ENCOUNTER — Other Ambulatory Visit: Payer: Self-pay

## 2021-01-03 ENCOUNTER — Ambulatory Visit (HOSPITAL_COMMUNITY): Payer: Medicare Other | Attending: Cardiology

## 2021-01-03 DIAGNOSIS — I10 Essential (primary) hypertension: Secondary | ICD-10-CM | POA: Insufficient documentation

## 2021-01-03 DIAGNOSIS — Z950 Presence of cardiac pacemaker: Secondary | ICD-10-CM | POA: Diagnosis not present

## 2021-01-03 DIAGNOSIS — I480 Paroxysmal atrial fibrillation: Secondary | ICD-10-CM | POA: Insufficient documentation

## 2021-01-03 DIAGNOSIS — R0602 Shortness of breath: Secondary | ICD-10-CM | POA: Insufficient documentation

## 2021-01-04 LAB — ECHOCARDIOGRAM COMPLETE
AR max vel: 1.18 cm2
AV Area VTI: 1.13 cm2
AV Area mean vel: 1.14 cm2
AV Mean grad: 11.6 mmHg
AV Peak grad: 22.2 mmHg
Ao pk vel: 2.35 m/s
Area-P 1/2: 4.89 cm2
P 1/2 time: 466 msec
S' Lateral: 2.5 cm

## 2021-01-10 DIAGNOSIS — Z23 Encounter for immunization: Secondary | ICD-10-CM | POA: Diagnosis not present

## 2021-01-10 DIAGNOSIS — Z0000 Encounter for general adult medical examination without abnormal findings: Secondary | ICD-10-CM | POA: Diagnosis not present

## 2021-01-10 DIAGNOSIS — Z1389 Encounter for screening for other disorder: Secondary | ICD-10-CM | POA: Diagnosis not present

## 2021-01-13 ENCOUNTER — Ambulatory Visit (INDEPENDENT_AMBULATORY_CARE_PROVIDER_SITE_OTHER): Payer: Medicare Other

## 2021-01-13 DIAGNOSIS — I441 Atrioventricular block, second degree: Secondary | ICD-10-CM | POA: Diagnosis not present

## 2021-01-13 LAB — CUP PACEART REMOTE DEVICE CHECK
Battery Remaining Longevity: 133 mo
Battery Voltage: 3.02 V
Brady Statistic AP VP Percent: 25.74 %
Brady Statistic AP VS Percent: 0.09 %
Brady Statistic AS VP Percent: 72.24 %
Brady Statistic AS VS Percent: 1.97 %
Brady Statistic RA Percent Paced: 23.23 %
Brady Statistic RV Percent Paced: 90.9 %
Date Time Interrogation Session: 20220926031926
Implantable Lead Implant Date: 20210628
Implantable Lead Implant Date: 20210628
Implantable Lead Location: 753859
Implantable Lead Location: 753860
Implantable Lead Model: 3830
Implantable Lead Model: 5076
Implantable Pulse Generator Implant Date: 20210628
Lead Channel Impedance Value: 304 Ohm
Lead Channel Impedance Value: 380 Ohm
Lead Channel Impedance Value: 437 Ohm
Lead Channel Impedance Value: 532 Ohm
Lead Channel Pacing Threshold Amplitude: 0.75 V
Lead Channel Pacing Threshold Amplitude: 1 V
Lead Channel Pacing Threshold Pulse Width: 0.4 ms
Lead Channel Pacing Threshold Pulse Width: 0.4 ms
Lead Channel Sensing Intrinsic Amplitude: 0.375 mV
Lead Channel Sensing Intrinsic Amplitude: 0.375 mV
Lead Channel Sensing Intrinsic Amplitude: 12.625 mV
Lead Channel Sensing Intrinsic Amplitude: 12.625 mV
Lead Channel Setting Pacing Amplitude: 2 V
Lead Channel Setting Pacing Amplitude: 2.5 V
Lead Channel Setting Pacing Pulse Width: 0.4 ms
Lead Channel Setting Sensing Sensitivity: 1.2 mV

## 2021-01-17 NOTE — Progress Notes (Signed)
Remote pacemaker transmission.   

## 2021-02-14 ENCOUNTER — Other Ambulatory Visit: Payer: Self-pay

## 2021-02-14 ENCOUNTER — Ambulatory Visit: Payer: Medicare Other | Admitting: Internal Medicine

## 2021-02-14 VITALS — BP 126/66 | HR 63 | Ht 68.5 in | Wt 152.0 lb

## 2021-02-14 DIAGNOSIS — I459 Conduction disorder, unspecified: Secondary | ICD-10-CM

## 2021-02-14 DIAGNOSIS — I480 Paroxysmal atrial fibrillation: Secondary | ICD-10-CM

## 2021-02-14 DIAGNOSIS — Z950 Presence of cardiac pacemaker: Secondary | ICD-10-CM | POA: Diagnosis not present

## 2021-02-14 LAB — CUP PACEART INCLINIC DEVICE CHECK
Battery Remaining Longevity: 133 mo
Battery Voltage: 3.02 V
Brady Statistic AP VP Percent: 23.29 %
Brady Statistic AP VS Percent: 0.06 %
Brady Statistic AS VP Percent: 74.98 %
Brady Statistic AS VS Percent: 1.72 %
Brady Statistic RA Percent Paced: 16.47 %
Brady Statistic RV Percent Paced: 89.7 %
Date Time Interrogation Session: 20221028162132
Implantable Lead Implant Date: 20210628
Implantable Lead Implant Date: 20210628
Implantable Lead Location: 753859
Implantable Lead Location: 753860
Implantable Lead Model: 3830
Implantable Lead Model: 5076
Implantable Pulse Generator Implant Date: 20210628
Lead Channel Impedance Value: 323 Ohm
Lead Channel Impedance Value: 399 Ohm
Lead Channel Impedance Value: 494 Ohm
Lead Channel Impedance Value: 532 Ohm
Lead Channel Pacing Threshold Amplitude: 0.75 V
Lead Channel Pacing Threshold Amplitude: 1 V
Lead Channel Pacing Threshold Pulse Width: 0.4 ms
Lead Channel Pacing Threshold Pulse Width: 0.4 ms
Lead Channel Sensing Intrinsic Amplitude: 1.625 mV
Lead Channel Sensing Intrinsic Amplitude: 12.875 mV
Lead Channel Sensing Intrinsic Amplitude: 13 mV
Lead Channel Sensing Intrinsic Amplitude: 2.125 mV
Lead Channel Setting Pacing Amplitude: 1.5 V
Lead Channel Setting Pacing Amplitude: 2.5 V
Lead Channel Setting Pacing Pulse Width: 0.4 ms
Lead Channel Setting Sensing Sensitivity: 1.2 mV

## 2021-02-14 NOTE — Patient Instructions (Addendum)
Medication Instructions:  Your physician recommends that you continue on your current medications as directed. Please refer to the Current Medication list given to you today.  Labwork: None ordered.  Testing/Procedures: None ordered.  Follow-Up: Your physician wants you to follow-up in: one year with Cristopher Peru, MD or one of the following Advanced Practice Providers on your designated Care Team:   Tommye Standard, [place] Legrand Como "Jonni Sanger" Chalmers Cater, [place]  Remote monitoring is used to monitor your Pacemaker from home. This monitoring reduces the number of office visits required to check your device to one time per year. It allows [place] to keep an eye on the functioning of your device to ensure it is working properly. You are scheduled for a device check from home on 04/15/2021. You may send your transmission at any time that day. If you have a wireless device, the transmission will be sent automatically. After your physician reviews your transmission, you will receive a postcard with your next transmission date.  Any Other Special Instructions Will Be Listed Below (If Applicable).  If you need a refill on your cardiac medications before your next appointment, please call your pharmacy.

## 2021-02-14 NOTE — Progress Notes (Signed)
HPI [name] returns today for followup. He is a pleasant 87 yo man with a h/o HTN, high grade/complete heart block, s/p PPM insertion. He has done well since his PPM was inserted. He has not had any dizzy spells and remains active. He has developed atypical atrial flutter and has been placed on xarelto. He denies palpitations. He has had worsening dyspnea and had an echo a month ago demonstrating preserved LV function and mild AS. Allergies  Allergen Reactions   Sulfa Antibiotics     Pt can't recall reaction     Current Outpatient Medications  Medication Sig Dispense Refill   telmisartan (MICARDIS) 20 MG tablet Take 20 mg by mouth daily.      XARELTO 15 MG TABS tablet Take 15 mg by mouth daily.     No current facility-administered medications for this visit.     Past Medical History:  Diagnosis Date   Arrhythmia    Atrial flutter ([place])    Hypertension    Vertigo     ROS:   All systems reviewed and negative except as noted in the HPI.   Past Surgical History:  Procedure Laterality Date   ANKLE ARTHROSCOPY     PACEMAKER IMPLANT N/A 10/16/2019   Procedure: PACEMAKER IMPLANT;  Surgeon: Evans Lance, MD;  Location: [facility];  Service: Cardiovascular;  Laterality: N/A;   TONSILLECTOMY       Family History  Problem Relation Age of Onset   Dementia Mother      Social History   Socioeconomic History   Marital status: Widowed    Spouse name: Not on file   Number of children: 1   Years of education: Not on file   Highest education level: Not on file  Occupational History   Occupation: retired- Art gallery manager  Tobacco Use   Smoking status: Former   Smokeless tobacco: Never  Substance and Sexual Activity   Alcohol use: Yes   Drug use: Not on file   Sexual activity: Not on file  Other Topics Concern   Not on file  Social History Narrative   Not on file   Social Determinants of Health   Financial Resource Strain: Not on file   Food Insecurity: Not on file  Transportation Needs: Not on file  Physical Activity: Not on file  Stress: Not on file  Social Connections: Not on file  Intimate Partner Violence: Not on file     BP 126/66   Pulse 63   Ht 5' 8.5" (1.74 m)   Wt 152 lb (68.9 kg)   SpO2 96%   BMI 22.78 kg/m   Physical Exam:  Well appearing NAD HEENT: Unremarkable Neck:  No JVD, no thyromegally Lymphatics:  No adenopathy Back:  No CVA tenderness Lungs:  Clear with no wheezes HEART:  Regular rate rhythm, no murmurs, no rubs, no clicks Abd:  soft, positive bowel sounds, no organomegally, no rebound, no guarding Ext:  2 plus pulses, no edema, no cyanosis, no clubbing Skin:  No rashes no nodules Neuro:  CN II through XII intact, motor grossly intact  DEVICE  Normal device function.  See PaceArt for details. We paced him back to NSR  Assess/Plan:  1. Atypical atrial flutter/fib - the patient has not had any symptoms despite going out of rhythm after his PPM insertion. He will need to continue systemic anti-coagulation indefinitely. We paced him back to NSR today. We will see if he feels better and  if so consider amiodarone.  2. HTN - his sbp is good. He will continue to take his current meds. He will need to keep a check on his bp at home.  3. PPM - his medtronic DDD PM is working normally. We will recheck in several months.   Scott Overlie [name],MD

## 2021-02-20 DIAGNOSIS — E782 Mixed hyperlipidemia: Secondary | ICD-10-CM | POA: Diagnosis not present

## 2021-02-20 DIAGNOSIS — N182 Chronic kidney disease, stage 2 (mild): Secondary | ICD-10-CM | POA: Diagnosis not present

## 2021-02-20 DIAGNOSIS — I4891 Unspecified atrial fibrillation: Secondary | ICD-10-CM | POA: Diagnosis not present

## 2021-02-20 DIAGNOSIS — I10 Essential (primary) hypertension: Secondary | ICD-10-CM | POA: Diagnosis not present

## 2021-03-31 DIAGNOSIS — J069 Acute upper respiratory infection, unspecified: Secondary | ICD-10-CM | POA: Diagnosis not present

## 2021-03-31 DIAGNOSIS — I4891 Unspecified atrial fibrillation: Secondary | ICD-10-CM | POA: Diagnosis not present

## 2021-03-31 DIAGNOSIS — U071 COVID-19: Secondary | ICD-10-CM | POA: Diagnosis not present

## 2021-04-15 ENCOUNTER — Ambulatory Visit (INDEPENDENT_AMBULATORY_CARE_PROVIDER_SITE_OTHER): Payer: Medicare Other

## 2021-04-15 DIAGNOSIS — I441 Atrioventricular block, second degree: Secondary | ICD-10-CM | POA: Diagnosis not present

## 2021-04-15 LAB — CUP PACEART REMOTE DEVICE CHECK
Battery Remaining Longevity: 129 mo
Battery Voltage: 3.02 V
Brady Statistic AP VP Percent: 62.89 %
Brady Statistic AP VS Percent: 0.22 %
Brady Statistic AS VP Percent: 32.8 %
Brady Statistic AS VS Percent: 4.09 %
Brady Statistic RA Percent Paced: 60.92 %
Brady Statistic RV Percent Paced: 94.3 %
Date Time Interrogation Session: 20221226210549
Implantable Lead Implant Date: 20210628
Implantable Lead Implant Date: 20210628
Implantable Lead Location: 753859
Implantable Lead Location: 753860
Implantable Lead Model: 3830
Implantable Lead Model: 5076
Implantable Pulse Generator Implant Date: 20210628
Lead Channel Impedance Value: 304 Ohm
Lead Channel Impedance Value: 380 Ohm
Lead Channel Impedance Value: 475 Ohm
Lead Channel Impedance Value: 532 Ohm
Lead Channel Pacing Threshold Amplitude: 0.875 V
Lead Channel Pacing Threshold Amplitude: 1.125 V
Lead Channel Pacing Threshold Pulse Width: 0.4 ms
Lead Channel Pacing Threshold Pulse Width: 0.4 ms
Lead Channel Sensing Intrinsic Amplitude: 1.375 mV
Lead Channel Sensing Intrinsic Amplitude: 1.375 mV
Lead Channel Sensing Intrinsic Amplitude: 13.125 mV
Lead Channel Sensing Intrinsic Amplitude: 13.125 mV
Lead Channel Setting Pacing Amplitude: 1.75 V
Lead Channel Setting Pacing Amplitude: 2.5 V
Lead Channel Setting Pacing Pulse Width: 0.4 ms
Lead Channel Setting Sensing Sensitivity: 1.2 mV

## 2021-04-23 NOTE — Progress Notes (Signed)
Remote pacemaker transmission.   

## 2021-04-28 DIAGNOSIS — I10 Essential (primary) hypertension: Secondary | ICD-10-CM | POA: Diagnosis not present

## 2021-04-28 DIAGNOSIS — E782 Mixed hyperlipidemia: Secondary | ICD-10-CM | POA: Diagnosis not present

## 2021-04-28 DIAGNOSIS — N182 Chronic kidney disease, stage 2 (mild): Secondary | ICD-10-CM | POA: Diagnosis not present

## 2021-04-28 DIAGNOSIS — I4891 Unspecified atrial fibrillation: Secondary | ICD-10-CM | POA: Diagnosis not present

## 2021-04-28 DIAGNOSIS — R413 Other amnesia: Secondary | ICD-10-CM | POA: Diagnosis not present

## 2021-06-09 NOTE — Progress Notes (Signed)
Cardiology Office Note   Date:  06/16/2021   ID:  Scott Richards, [place] 1926-05-05, MRN [id number]  PCP:  Leeroy Cha, MD  Cardiologist:  Joellyn Grandt Martinique, MD EP: None  Chief Complaint  Patient presents with   Follow-up    6 months.       History of Present Illness: Scott Richards is a 88 y.o. male with a PMH of paroxysmal atrial fibrillation/flutter, CHB s/p PPM 09/2019, HTN, and vertigo, who presents for follow-up.   He saw Dr. Lovena Le 01/2020 for post-PPM placement follow-up and was without recurrent dizzy spells. His last device interrogation 03/2021 showed normal device function and 5.5% Afib burden. His last echocardiogram in Sept 2022 showed EF 55-60%, mild-moderate AI/TR, mild AS,mild MR, and mild PR.  On follow up today he is doing well. Has chronic SOB but still does all his own yard work. No dizziness or chest pain. Doesn't have an appetite and has lost his taste. As a result he has lost 15 lbs in the last 2 years.     Past Medical History:  Diagnosis Date   Arrhythmia    Atrial flutter ([place])    Hypertension    Vertigo     Past Surgical History:  Procedure Laterality Date   ANKLE ARTHROSCOPY     PACEMAKER IMPLANT N/A 10/16/2019   Procedure: PACEMAKER IMPLANT;  Surgeon: Evans Lance, MD;  Location: [facility];  Service: Cardiovascular;  Laterality: N/A;   TONSILLECTOMY       Current Outpatient Medications  Medication Sig Dispense Refill   Docusate Sodium (COLACE PO) Take 1 tablet by mouth daily.     telmisartan (MICARDIS) 20 MG tablet Take 20 mg by mouth daily.      VITAMIN D, CHOLECALCIFEROL, PO Take 1 capsule by mouth daily.     XARELTO 15 MG TABS tablet Take 15 mg by mouth daily.     No current facility-administered medications for this visit.    Allergies:   Sulfa antibiotics    Social History:  The patient  reports that he has quit smoking. He has never used smokeless tobacco. He reports current alcohol use.   Family History:   The patient's family history includes Dementia in his mother.    ROS:  Please see the history of present illness.   Otherwise, review of systems are positive for none.   All other systems are reviewed and negative.    PHYSICAL EXAM: VS:  BP 138/70 (BP Location: Left Arm, Patient Position: Sitting, Cuff Size: Normal)    Pulse 73    Ht 5' 8.5" (1.74 m)    Wt 144 lb (65.3 kg)    BMI 21.58 kg/m  , BMI Body mass index is 21.58 kg/m. GEN: Well nourished, well developed, in no acute distress; appears younger than stated age 38: sclera anicteric Neck: no JVD, carotid bruits, or masses Cardiac: RRR; + murmur, no rubs or gallops,no edema, pacer site looks good.  Respiratory:  clear to auscultation bilaterally, normal work of breathing GI: soft, nontender, nondistended, + BS MS: no deformity or atrophy Skin: warm and dry, no rash Neuro:  Strength and sensation are intact Psych: euthymic mood, full affect   EKG:  EKG is not ordered today.    Recent Labs: No results found for requested labs within last 8760 hours.    Lipid Panel No results found for: CHOL, TRIG, HDL, CHOLHDL, VLDL, LDLCALC, LDLDIRECT   Dated 11/28/18: cholesterol 171, triglycerides 88, HDL 56,  LDL 97.  Dated 12/02/20: CBC, CMET and TSH normal.  Wt Readings from Last 3 Encounters:  06/16/21 144 lb (65.3 kg)  02/14/21 152 lb (68.9 kg)  12/13/20 153 lb 3.2 oz (69.5 kg)      Other studies Reviewed: Additional studies/ records that were reviewed today include:   Echo 01/03/21: IMPRESSIONS     1. Left ventricular ejection fraction, by estimation, is 55 to 60%. The  left ventricle has normal function. The left ventricle has no regional  wall motion abnormalities. There is mild asymmetric left ventricular  hypertrophy of the basal-septal segment.  Left ventricular diastolic function could not be evaluated. The average  left ventricular global longitudinal strain is -19.3 %. The global  longitudinal strain is  normal.   2. Right ventricular systolic function is normal. The right ventricular  size is normal. There is normal pulmonary artery systolic pressure. The  estimated right ventricular systolic pressure is 34.2 mmHg.   3. Right atrial size was mild to moderately dilated.   4. The mitral valve is normal in structure. Mild mitral valve  regurgitation. No evidence of mitral stenosis.   5. Tricuspid valve regurgitation is moderate.   6. The aortic valve is calcified. There is moderate calcification of the  aortic valve. There is moderate thickening of the aortic valve. Aortic  valve regurgitation is mild to moderate. Mild aortic valve stenosis.  Aortic valve area, by VTI measures 1.13  cm. Aortic valve mean gradient measures 11.6 mmHg. Aortic valve Vmax  measures 2.35 m/s.   7. The inferior vena cava is normal in size with greater than 50%  respiratory variability, suggesting right atrial pressure of 3 mmHg.   Comparison(s): Prior images unable to be directly viewed, comparison made  by report only. Report only. 11/25/15 EF 56%.     ASSESSMENT AND PLAN:  1. Paroxysmal atrial fibrillation: 5.5% burden on last device interrogation 03/2021. Remains relatively asymptomatic  - Continue xarelto for stroke ppx  2. HTN: BP controlled - Continue telmisartan - Encourage low salt diet  3. CHB s/p PPM 09/2019: device functioning appropriately on last check 03/2021 - Continue routine outpatient monitoring  - Continue follow-up with Dr. Lovena Le  4. Valvulopathy: mild-moderate AI/TR, mild AS,mild MR, and mild PR noted on last echo from Sept 2022. No significant change from 2017. Can't really explain his chronic dyspnea. He was a smoker in the past.    Current medicines are reviewed at length with the patient today.  The patient does not have concerns regarding medicines.  The following changes have been made:  As above  Labs/ tests ordered today include:   No orders of the defined types were  placed in this encounter.     Disposition:   FU with Dr. Martinique in 12 months  Signed, Sadao Weyer Martinique, MD  06/16/2021 4:13 PM

## 2021-06-16 ENCOUNTER — Other Ambulatory Visit: Payer: Self-pay

## 2021-06-16 ENCOUNTER — Encounter: Payer: Self-pay | Admitting: Cardiology

## 2021-06-16 ENCOUNTER — Ambulatory Visit: Payer: Medicare Other | Admitting: Cardiology

## 2021-06-16 VITALS — BP 138/70 | HR 73 | Ht 68.5 in | Wt 144.0 lb

## 2021-06-16 DIAGNOSIS — I480 Paroxysmal atrial fibrillation: Secondary | ICD-10-CM | POA: Diagnosis not present

## 2021-06-16 DIAGNOSIS — I459 Conduction disorder, unspecified: Secondary | ICD-10-CM

## 2021-06-16 DIAGNOSIS — Z950 Presence of cardiac pacemaker: Secondary | ICD-10-CM

## 2021-06-16 DIAGNOSIS — I10 Essential (primary) hypertension: Secondary | ICD-10-CM | POA: Diagnosis not present

## 2021-07-14 ENCOUNTER — Ambulatory Visit (INDEPENDENT_AMBULATORY_CARE_PROVIDER_SITE_OTHER): Payer: Medicare Other

## 2021-07-14 DIAGNOSIS — I459 Conduction disorder, unspecified: Secondary | ICD-10-CM | POA: Diagnosis not present

## 2021-07-15 LAB — CUP PACEART REMOTE DEVICE CHECK
Battery Remaining Longevity: 126 mo
Battery Voltage: 3.01 V
Brady Statistic AP VP Percent: 17.2 %
Brady Statistic AP VS Percent: 0.23 %
Brady Statistic AS VP Percent: 78.84 %
Brady Statistic AS VS Percent: 3.75 %
Brady Statistic RA Percent Paced: 17.96 %
Brady Statistic RV Percent Paced: 90.16 %
Date Time Interrogation Session: 20230326230814
Implantable Lead Implant Date: 20210628
Implantable Lead Implant Date: 20210628
Implantable Lead Location: 753859
Implantable Lead Location: 753860
Implantable Lead Model: 3830
Implantable Lead Model: 5076
Implantable Pulse Generator Implant Date: 20210628
Lead Channel Impedance Value: 304 Ohm
Lead Channel Impedance Value: 361 Ohm
Lead Channel Impedance Value: 475 Ohm
Lead Channel Impedance Value: 513 Ohm
Lead Channel Pacing Threshold Amplitude: 0.875 V
Lead Channel Pacing Threshold Amplitude: 1.125 V
Lead Channel Pacing Threshold Pulse Width: 0.4 ms
Lead Channel Pacing Threshold Pulse Width: 0.4 ms
Lead Channel Sensing Intrinsic Amplitude: 1.25 mV
Lead Channel Sensing Intrinsic Amplitude: 1.25 mV
Lead Channel Sensing Intrinsic Amplitude: 14.875 mV
Lead Channel Sensing Intrinsic Amplitude: 14.875 mV
Lead Channel Setting Pacing Amplitude: 1.75 V
Lead Channel Setting Pacing Amplitude: 2.5 V
Lead Channel Setting Pacing Pulse Width: 0.4 ms
Lead Channel Setting Sensing Sensitivity: 1.2 mV

## 2021-07-23 NOTE — Progress Notes (Signed)
Remote pacemaker transmission.   

## 2021-10-13 ENCOUNTER — Ambulatory Visit (INDEPENDENT_AMBULATORY_CARE_PROVIDER_SITE_OTHER): Payer: Medicare Other

## 2021-10-13 DIAGNOSIS — R55 Syncope and collapse: Secondary | ICD-10-CM

## 2021-10-13 LAB — CUP PACEART REMOTE DEVICE CHECK
Battery Remaining Longevity: 122 mo
Battery Voltage: 3.01 V
Brady Statistic AP VP Percent: 15.7 %
Brady Statistic AP VS Percent: 0.33 %
Brady Statistic AS VP Percent: 78.36 %
Brady Statistic AS VS Percent: 5.63 %
Brady Statistic RA Percent Paced: 17.34 %
Brady Statistic RV Percent Paced: 91.26 %
Date Time Interrogation Session: 20230625224316
Implantable Lead Implant Date: 20210628
Implantable Lead Implant Date: 20210628
Implantable Lead Location: 753859
Implantable Lead Location: 753860
Implantable Lead Model: 3830
Implantable Lead Model: 5076
Implantable Pulse Generator Implant Date: 20210628
Lead Channel Impedance Value: 304 Ohm
Lead Channel Impedance Value: 342 Ohm
Lead Channel Impedance Value: 437 Ohm
Lead Channel Impedance Value: 513 Ohm
Lead Channel Pacing Threshold Amplitude: 0.875 V
Lead Channel Pacing Threshold Amplitude: 1.125 V
Lead Channel Pacing Threshold Pulse Width: 0.4 ms
Lead Channel Pacing Threshold Pulse Width: 0.4 ms
Lead Channel Sensing Intrinsic Amplitude: 1.25 mV
Lead Channel Sensing Intrinsic Amplitude: 1.25 mV
Lead Channel Sensing Intrinsic Amplitude: 13.75 mV
Lead Channel Sensing Intrinsic Amplitude: 13.75 mV
Lead Channel Setting Pacing Amplitude: 1.75 V
Lead Channel Setting Pacing Amplitude: 2.5 V
Lead Channel Setting Pacing Pulse Width: 0.4 ms
Lead Channel Setting Sensing Sensitivity: 1.2 mV

## 2021-11-05 NOTE — Progress Notes (Signed)
Remote pacemaker transmission.   

## 2021-12-18 DIAGNOSIS — C4442 Squamous cell carcinoma of skin of scalp and neck: Secondary | ICD-10-CM | POA: Diagnosis not present

## 2021-12-18 DIAGNOSIS — C44222 Squamous cell carcinoma of skin of right ear and external auricular canal: Secondary | ICD-10-CM | POA: Diagnosis not present

## 2021-12-18 DIAGNOSIS — C44329 Squamous cell carcinoma of skin of other parts of face: Secondary | ICD-10-CM | POA: Diagnosis not present

## 2021-12-18 DIAGNOSIS — Z85828 Personal history of other malignant neoplasm of skin: Secondary | ICD-10-CM | POA: Diagnosis not present

## 2021-12-18 DIAGNOSIS — L821 Other seborrheic keratosis: Secondary | ICD-10-CM | POA: Diagnosis not present

## 2021-12-18 DIAGNOSIS — L570 Actinic keratosis: Secondary | ICD-10-CM | POA: Diagnosis not present

## 2021-12-31 DIAGNOSIS — C44329 Squamous cell carcinoma of skin of other parts of face: Secondary | ICD-10-CM | POA: Diagnosis not present

## 2022-01-06 IMAGING — CR DG CHEST 2V
2 series · 2 of 2 positions shown · non-contrast
Comparison: November 15, 2015

CLINICAL DATA: Pacemaker placement

EXAM:
CHEST - 2 VIEW

[w chest pa]
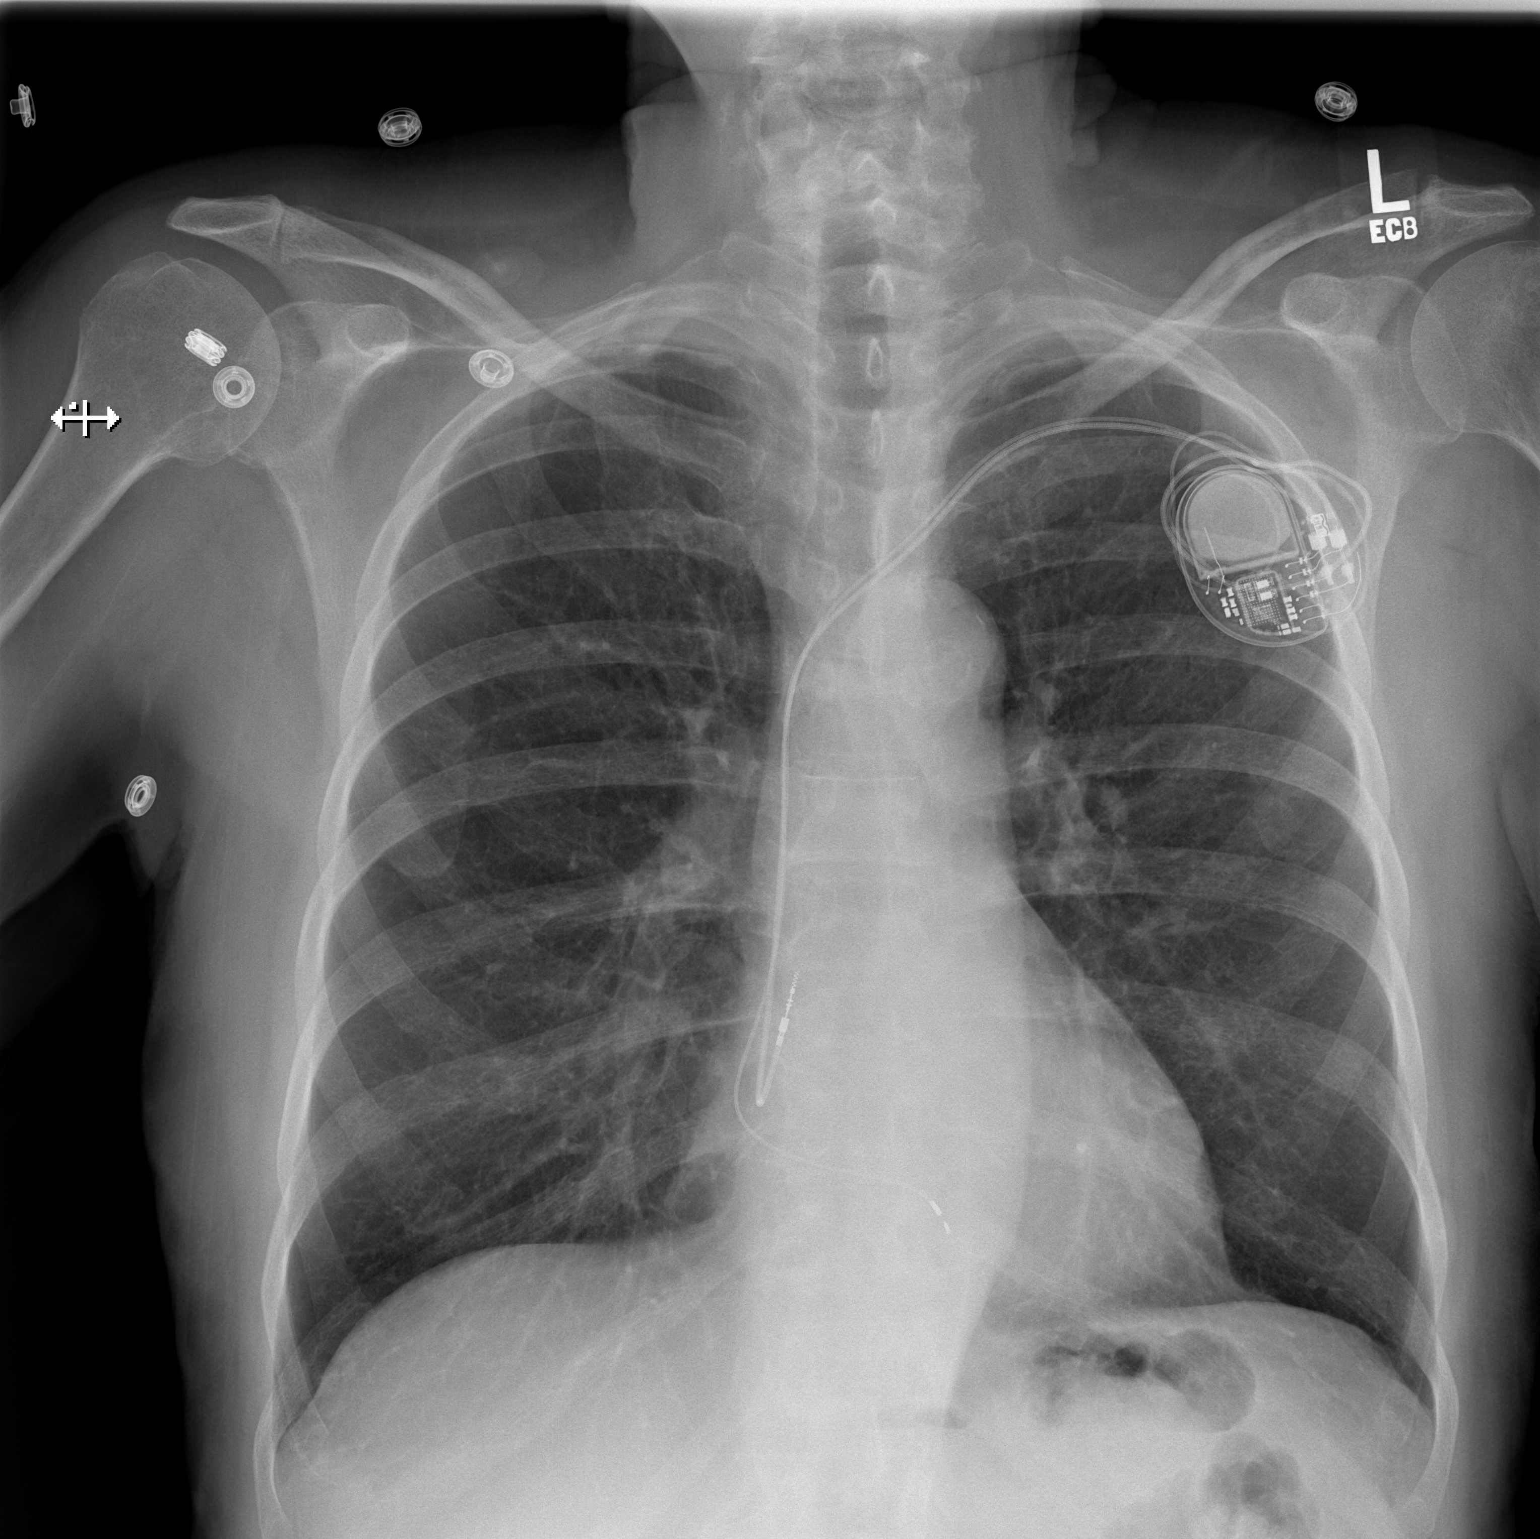

[w chest lat]
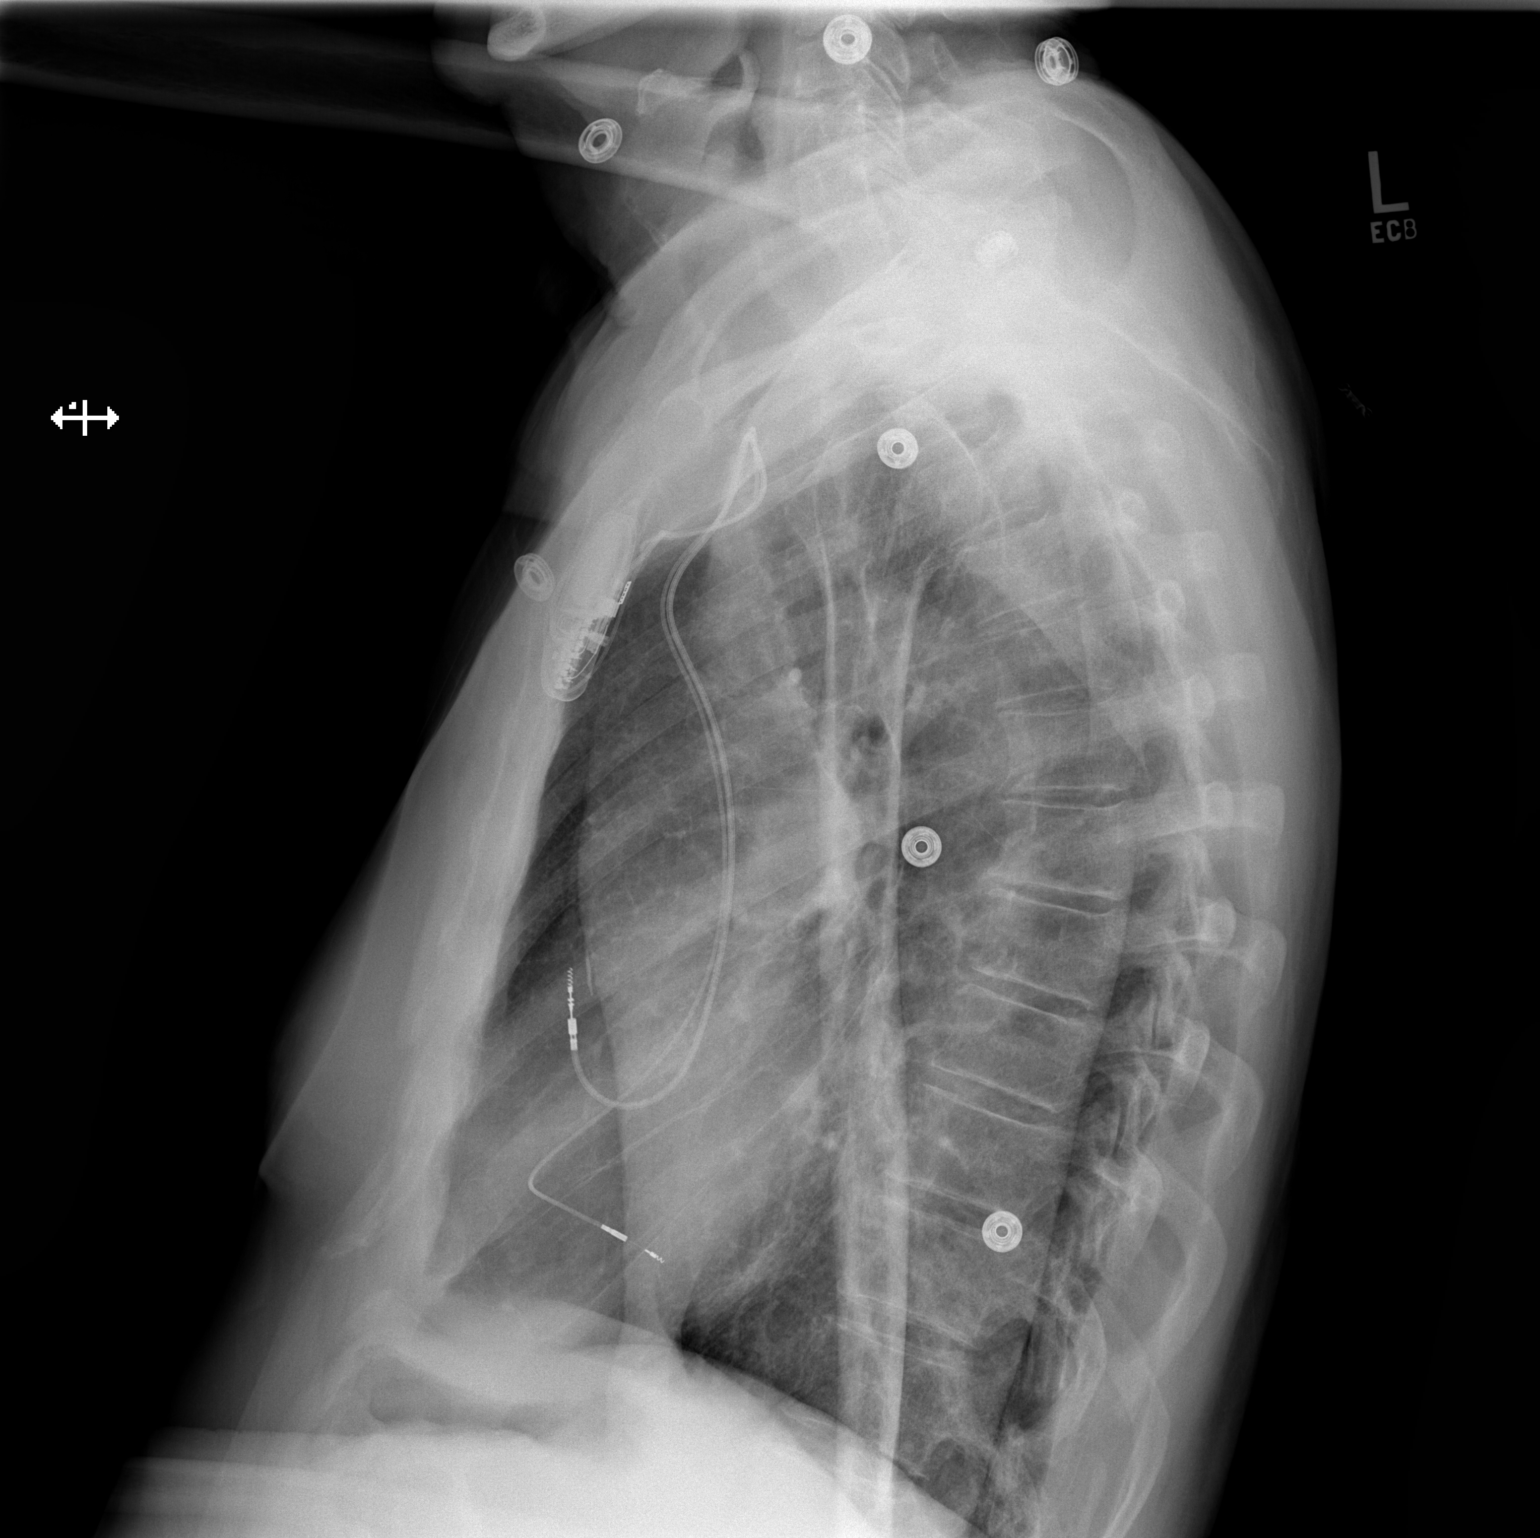

[2 of 2 positions shown; findings below may reference images not displayed]

FINDINGS: The patient has undergone left-sided dual chamber pacemaker
placement. There is no evidence for left-sided pneumothorax. The
lungs are clear. The heart size is normal. Aortic calcifications are
noted. There is no evidence for an acute osseous abnormality.
IMPRESSION: Left-sided pacemaker placement without evidence for pneumothorax.

## 2022-01-12 ENCOUNTER — Ambulatory Visit (INDEPENDENT_AMBULATORY_CARE_PROVIDER_SITE_OTHER): Payer: Medicare Other

## 2022-01-12 DIAGNOSIS — R946 Abnormal results of thyroid function studies: Secondary | ICD-10-CM | POA: Diagnosis not present

## 2022-01-12 DIAGNOSIS — N1831 Chronic kidney disease, stage 3a: Secondary | ICD-10-CM | POA: Diagnosis not present

## 2022-01-12 DIAGNOSIS — H903 Sensorineural hearing loss, bilateral: Secondary | ICD-10-CM | POA: Diagnosis not present

## 2022-01-12 DIAGNOSIS — Z950 Presence of cardiac pacemaker: Secondary | ICD-10-CM | POA: Diagnosis not present

## 2022-01-12 DIAGNOSIS — R413 Other amnesia: Secondary | ICD-10-CM | POA: Diagnosis not present

## 2022-01-12 DIAGNOSIS — R7989 Other specified abnormal findings of blood chemistry: Secondary | ICD-10-CM | POA: Diagnosis not present

## 2022-01-12 DIAGNOSIS — Z23 Encounter for immunization: Secondary | ICD-10-CM | POA: Diagnosis not present

## 2022-01-12 DIAGNOSIS — R55 Syncope and collapse: Secondary | ICD-10-CM | POA: Diagnosis not present

## 2022-01-12 DIAGNOSIS — I10 Essential (primary) hypertension: Secondary | ICD-10-CM | POA: Diagnosis not present

## 2022-01-12 DIAGNOSIS — Z0000 Encounter for general adult medical examination without abnormal findings: Secondary | ICD-10-CM | POA: Diagnosis not present

## 2022-01-12 DIAGNOSIS — E782 Mixed hyperlipidemia: Secondary | ICD-10-CM | POA: Diagnosis not present

## 2022-01-12 DIAGNOSIS — D6859 Other primary thrombophilia: Secondary | ICD-10-CM | POA: Diagnosis not present

## 2022-01-12 DIAGNOSIS — I4892 Unspecified atrial flutter: Secondary | ICD-10-CM | POA: Diagnosis not present

## 2022-01-13 LAB — CUP PACEART REMOTE DEVICE CHECK
Battery Remaining Longevity: 118 mo
Battery Voltage: 3 V
Brady Statistic AP VP Percent: 14.83 %
Brady Statistic AP VS Percent: 0.31 %
Brady Statistic AS VP Percent: 79.06 %
Brady Statistic AS VS Percent: 5.81 %
Brady Statistic RA Percent Paced: 16.44 %
Brady Statistic RV Percent Paced: 92.88 %
Date Time Interrogation Session: 20230925022705
Implantable Lead Implant Date: 20210628
Implantable Lead Implant Date: 20210628
Implantable Lead Location: 753859
Implantable Lead Location: 753860
Implantable Lead Model: 3830
Implantable Lead Model: 5076
Implantable Pulse Generator Implant Date: 20210628
Lead Channel Impedance Value: 304 Ohm
Lead Channel Impedance Value: 361 Ohm
Lead Channel Impedance Value: 418 Ohm
Lead Channel Impedance Value: 513 Ohm
Lead Channel Pacing Threshold Amplitude: 0.875 V
Lead Channel Pacing Threshold Amplitude: 1.125 V
Lead Channel Pacing Threshold Pulse Width: 0.4 ms
Lead Channel Pacing Threshold Pulse Width: 0.4 ms
Lead Channel Sensing Intrinsic Amplitude: 1.375 mV
Lead Channel Sensing Intrinsic Amplitude: 1.375 mV
Lead Channel Sensing Intrinsic Amplitude: 12.375 mV
Lead Channel Sensing Intrinsic Amplitude: 12.375 mV
Lead Channel Setting Pacing Amplitude: 1.75 V
Lead Channel Setting Pacing Amplitude: 2.5 V
Lead Channel Setting Pacing Pulse Width: 0.4 ms
Lead Channel Setting Sensing Sensitivity: 1.2 mV

## 2022-01-21 DIAGNOSIS — C44222 Squamous cell carcinoma of skin of right ear and external auricular canal: Secondary | ICD-10-CM | POA: Diagnosis not present

## 2022-01-21 NOTE — Progress Notes (Signed)
Remote pacemaker transmission.   

## 2022-01-23 ENCOUNTER — Encounter: Payer: Self-pay | Admitting: Internal Medicine

## 2022-01-23 ENCOUNTER — Ambulatory Visit: Payer: Medicare Other | Attending: Internal Medicine | Admitting: Internal Medicine

## 2022-01-23 VITALS — BP 138/60 | HR 83 | Ht 68.5 in | Wt 148.0 lb

## 2022-01-23 DIAGNOSIS — Z950 Presence of cardiac pacemaker: Secondary | ICD-10-CM | POA: Diagnosis not present

## 2022-01-23 DIAGNOSIS — I480 Paroxysmal atrial fibrillation: Secondary | ICD-10-CM | POA: Diagnosis not present

## 2022-01-23 DIAGNOSIS — I10 Essential (primary) hypertension: Secondary | ICD-10-CM

## 2022-01-23 DIAGNOSIS — I4892 Unspecified atrial flutter: Secondary | ICD-10-CM | POA: Insufficient documentation

## 2022-01-23 DIAGNOSIS — I484 Atypical atrial flutter: Secondary | ICD-10-CM

## 2022-01-23 DIAGNOSIS — I459 Conduction disorder, unspecified: Secondary | ICD-10-CM | POA: Diagnosis not present

## 2022-01-23 LAB — CUP PACEART INCLINIC DEVICE CHECK
Battery Remaining Longevity: 122 mo
Battery Voltage: 3 V
Brady Statistic AP VP Percent: 25.69 %
Brady Statistic AP VS Percent: 0.27 %
Brady Statistic AS VP Percent: 69.22 %
Brady Statistic AS VS Percent: 4.82 %
Brady Statistic RA Percent Paced: 26.23 %
Brady Statistic RV Percent Paced: 92.16 %
Date Time Interrogation Session: 20231006160256
Implantable Lead Implant Date: 20210628
Implantable Lead Implant Date: 20210628
Implantable Lead Location: 753859
Implantable Lead Location: 753860
Implantable Lead Model: 3830
Implantable Lead Model: 5076
Implantable Pulse Generator Implant Date: 20210628
Lead Channel Impedance Value: 342 Ohm
Lead Channel Impedance Value: 418 Ohm
Lead Channel Impedance Value: 456 Ohm
Lead Channel Impedance Value: 589 Ohm
Lead Channel Pacing Threshold Amplitude: 0.75 V
Lead Channel Pacing Threshold Amplitude: 1.125 V
Lead Channel Pacing Threshold Pulse Width: 0.4 ms
Lead Channel Pacing Threshold Pulse Width: 0.4 ms
Lead Channel Sensing Intrinsic Amplitude: 0.625 mV
Lead Channel Sensing Intrinsic Amplitude: 1.25 mV
Lead Channel Sensing Intrinsic Amplitude: 13.75 mV
Lead Channel Sensing Intrinsic Amplitude: 15.625 mV
Lead Channel Setting Pacing Amplitude: 1.5 V
Lead Channel Setting Pacing Amplitude: 2.5 V
Lead Channel Setting Pacing Pulse Width: 0.4 ms
Lead Channel Setting Sensing Sensitivity: 1.2 mV

## 2022-01-23 NOTE — Patient Instructions (Signed)
Medication Instructions:  Your physician recommends that you continue on your current medications as directed. Please refer to the Current Medication list given to you today.  *If you need a refill on your cardiac medications before your next appointment, please call your pharmacy*  Lab Work: None ordered.  If you have labs (blood work) drawn today and your tests are completely normal, you will receive your results only by: [place] (if you have MyChart) OR A paper copy in the mail If you have any lab test that is abnormal or we need to change your treatment, we will call you to review the results.  Testing/Procedures: None ordered.  Follow-Up: At [facility], you and your health needs are our priority.  As part of our continuing mission to provide you with exceptional heart care, we have created designated Provider Care Teams.  These Care Teams include your primary Cardiologist (physician) and Advanced Practice Providers (APPs -  Physician Assistants and Nurse Practitioners) who all work together to provide you with the care you need, when you need it.  We recommend signing up for the patient portal called "MyChart".  Sign up information is provided on this After Visit Summary.  MyChart is used to connect with patients for Virtual Visits (Telemedicine).  Patients are able to view lab/test results, encounter notes, upcoming appointments, etc.  Non-urgent messages can be sent to your provider as well.   To learn more about what you can do with MyChart, go to [email or web address].    Your next appointment:   1 year(s)  The format for your next appointment:   In Person  Provider:   Cristopher Peru, MD{or one of the following Advanced Practice Providers on your designated Care Team:   Tommye Standard, [place] Legrand Como "Jonni Sanger" Chalmers Cater, [place]  Remote monitoring is used to monitor your Pacemaker from home. This monitoring reduces the number of office visits required to check your device to  one time per year. It allows [place] to keep an eye on the functioning of your device to ensure it is working properly. You are scheduled for a device check from home on 04/15/22. You may send your transmission at any time that day. If you have a wireless device, the transmission will be sent automatically. After your physician reviews your transmission, you will receive a postcard with your next transmission date.  Important Information About Sugar     '

## 2022-01-23 NOTE — Progress Notes (Signed)
HPI [name] returns today for followup. He is a pleasant 88 yo man with a h/o HTN, high grade/complete heart block, s/p PPM insertion. He has done well since his PPM was inserted. He has not had any dizzy spells and remains active. He has developed atypical atrial flutter and has been placed on xarelto. He denies palpitations. He has had worsening dyspnea and had an echo a month ago demonstrating preserved LV function and mild AS. Allergies  Allergen Reactions   Sulfa Antibiotics     Pt can't recall reaction     Current Outpatient Medications  Medication Sig Dispense Refill   Docusate Sodium (COLACE PO) Take 1 tablet by mouth daily.     telmisartan (MICARDIS) 20 MG tablet Take 20 mg by mouth daily.      VITAMIN D, CHOLECALCIFEROL, PO Take 1 capsule by mouth daily.     XARELTO 15 MG TABS tablet Take 15 mg by mouth daily.     No current facility-administered medications for this visit.     Past Medical History:  Diagnosis Date   Arrhythmia    Atrial flutter ([place])    Hypertension    Vertigo     ROS:   All systems reviewed and negative except as noted in the HPI.   Past Surgical History:  Procedure Laterality Date   ANKLE ARTHROSCOPY     PACEMAKER IMPLANT N/A 10/16/2019   Procedure: PACEMAKER IMPLANT;  Surgeon: Scott Lance, MD;  Location: [facility];  Service: Cardiovascular;  Laterality: N/A;   TONSILLECTOMY       Family History  Problem Relation Age of Onset   Dementia Mother      Social History   Socioeconomic History   Marital status: Widowed    Spouse name: Not on file   Number of children: 1   Years of education: Not on file   Highest education level: Not on file  Occupational History   Occupation: retired- Art gallery manager  Tobacco Use   Smoking status: Former   Smokeless tobacco: Never  Substance and Sexual Activity   Alcohol use: Yes   Drug use: Not on file   Sexual activity: Not on file  Other Topics Concern   Not  on file  Social History Narrative   Not on file   Social Determinants of Health   Financial Resource Strain: Not on file  Food Insecurity: Not on file  Transportation Needs: Not on file  Physical Activity: Not on file  Stress: Not on file  Social Connections: Not on file  Intimate Partner Violence: Not on file     BP 138/60   Pulse 83   Ht 5' 8.5" (1.74 m)   Wt 148 lb (67.1 kg)   SpO2 98%   BMI 22.18 kg/m   Physical Exam:  Well appearing NAD HEENT: Unremarkable Neck:  No JVD, no thyromegally Lymphatics:  No adenopathy Back:  No CVA tenderness Lungs:  Clear HEART:  Regular rate rhythm, no murmurs, no rubs, no clicks Abd:  soft, positive bowel sounds, no organomegally, no rebound, no guarding Ext:  2 plus pulses, no edema, no cyanosis, no clubbing Skin:  No rashes no nodules Neuro:  CN II through XII intact, motor grossly intact  EKG  DEVICE  Normal device function.  See PaceArt for details.   Assess/Plan:  1. Atypical atrial flutter/fib - the patient has not had any symptoms despite going out of rhythm after his PPM insertion. He  will need to continue systemic anti-coagulation indefinitely.  2. HTN - his sbp is good. He will continue to take his current meds. He will need to keep a check on his bp at home.  3. PPM - his medtronic DDD PM is working normally. We will recheck in several months.   Scott Overlie [name],MD

## 2022-02-20 DIAGNOSIS — H6090 Unspecified otitis externa, unspecified ear: Secondary | ICD-10-CM | POA: Diagnosis not present

## 2022-07-10 DIAGNOSIS — R319 Hematuria, unspecified: Secondary | ICD-10-CM | POA: Diagnosis not present

## 2022-07-10 DIAGNOSIS — N1831 Chronic kidney disease, stage 3a: Secondary | ICD-10-CM | POA: Diagnosis not present

## 2022-07-10 DIAGNOSIS — I4892 Unspecified atrial flutter: Secondary | ICD-10-CM | POA: Diagnosis not present

## 2022-07-10 DIAGNOSIS — I10 Essential (primary) hypertension: Secondary | ICD-10-CM | POA: Diagnosis not present

## 2022-07-10 DIAGNOSIS — G319 Degenerative disease of nervous system, unspecified: Secondary | ICD-10-CM | POA: Diagnosis not present

## 2022-07-13 LAB — CUP PACEART REMOTE DEVICE CHECK
Battery Remaining Longevity: 112 mo
Battery Voltage: 3 V
Brady Statistic AP VP Percent: 74.89 %
Brady Statistic AP VS Percent: 0.03 %
Brady Statistic AS VP Percent: 23.18 %
Brady Statistic AS VS Percent: 1.9 %
Brady Statistic RA Percent Paced: 75.79 %
Brady Statistic RV Percent Paced: 97.95 %
Date Time Interrogation Session: 20240325051654
Implantable Lead Connection Status: 753985
Implantable Lead Connection Status: 753985
Implantable Lead Implant Date: 20210628
Implantable Lead Implant Date: 20210628
Implantable Lead Location: 753859
Implantable Lead Location: 753860
Implantable Lead Model: 3830
Implantable Lead Model: 5076
Implantable Pulse Generator Implant Date: 20210628
Lead Channel Impedance Value: 304 Ohm
Lead Channel Impedance Value: 361 Ohm
Lead Channel Impedance Value: 399 Ohm
Lead Channel Impedance Value: 532 Ohm
Lead Channel Pacing Threshold Amplitude: 0.625 V
Lead Channel Pacing Threshold Amplitude: 1.125 V
Lead Channel Pacing Threshold Pulse Width: 0.4 ms
Lead Channel Pacing Threshold Pulse Width: 0.4 ms
Lead Channel Sensing Intrinsic Amplitude: 0.875 mV
Lead Channel Sensing Intrinsic Amplitude: 0.875 mV
Lead Channel Sensing Intrinsic Amplitude: 14.25 mV
Lead Channel Sensing Intrinsic Amplitude: 14.25 mV
Lead Channel Setting Pacing Amplitude: 1.5 V
Lead Channel Setting Pacing Amplitude: 2.5 V
Lead Channel Setting Pacing Pulse Width: 0.4 ms
Lead Channel Setting Sensing Sensitivity: 1.2 mV
Zone Setting Status: 755011
Zone Setting Status: 755011

## 2022-07-15 ENCOUNTER — Ambulatory Visit (INDEPENDENT_AMBULATORY_CARE_PROVIDER_SITE_OTHER): Payer: No Typology Code available for payment source

## 2022-07-15 DIAGNOSIS — I459 Conduction disorder, unspecified: Secondary | ICD-10-CM

## 2022-08-10 DIAGNOSIS — D1801 Hemangioma of skin and subcutaneous tissue: Secondary | ICD-10-CM | POA: Diagnosis not present

## 2022-08-10 DIAGNOSIS — D485 Neoplasm of uncertain behavior of skin: Secondary | ICD-10-CM | POA: Diagnosis not present

## 2022-08-10 DIAGNOSIS — Z85828 Personal history of other malignant neoplasm of skin: Secondary | ICD-10-CM | POA: Diagnosis not present

## 2022-08-10 DIAGNOSIS — L570 Actinic keratosis: Secondary | ICD-10-CM | POA: Diagnosis not present

## 2022-08-10 DIAGNOSIS — L821 Other seborrheic keratosis: Secondary | ICD-10-CM | POA: Diagnosis not present

## 2022-08-24 NOTE — Progress Notes (Signed)
Remote pacemaker transmission.   

## 2022-10-12 ENCOUNTER — Ambulatory Visit: Payer: Medicare Other

## 2022-10-14 ENCOUNTER — Ambulatory Visit (INDEPENDENT_AMBULATORY_CARE_PROVIDER_SITE_OTHER): Payer: Medicare Other

## 2022-10-14 DIAGNOSIS — I459 Conduction disorder, unspecified: Secondary | ICD-10-CM | POA: Diagnosis not present

## 2022-10-15 LAB — CUP PACEART REMOTE DEVICE CHECK
Battery Remaining Longevity: 108 mo
Battery Voltage: 3 V
Brady Statistic AP VP Percent: 74.86 %
Brady Statistic AP VS Percent: 0.05 %
Brady Statistic AS VP Percent: 22.63 %
Brady Statistic AS VS Percent: 2.45 %
Brady Statistic RA Percent Paced: 52.03 %
Brady Statistic RV Percent Paced: 94.07 %
Date Time Interrogation Session: 20240624051745
Implantable Lead Connection Status: 753985
Implantable Lead Connection Status: 753985
Implantable Lead Implant Date: 20210628
Implantable Lead Implant Date: 20210628
Implantable Lead Location: 753859
Implantable Lead Location: 753860
Implantable Lead Model: 3830
Implantable Lead Model: 5076
Implantable Pulse Generator Implant Date: 20210628
Lead Channel Impedance Value: 285 Ohm
Lead Channel Impedance Value: 361 Ohm
Lead Channel Impedance Value: 380 Ohm
Lead Channel Impedance Value: 532 Ohm
Lead Channel Pacing Threshold Amplitude: 0.625 V
Lead Channel Pacing Threshold Amplitude: 1 V
Lead Channel Pacing Threshold Pulse Width: 0.4 ms
Lead Channel Pacing Threshold Pulse Width: 0.4 ms
Lead Channel Sensing Intrinsic Amplitude: 0.625 mV
Lead Channel Sensing Intrinsic Amplitude: 0.625 mV
Lead Channel Sensing Intrinsic Amplitude: 13.125 mV
Lead Channel Sensing Intrinsic Amplitude: 13.125 mV
Lead Channel Setting Pacing Amplitude: 1.5 V
Lead Channel Setting Pacing Amplitude: 2.5 V
Lead Channel Setting Pacing Pulse Width: 0.4 ms
Lead Channel Setting Sensing Sensitivity: 1.2 mV
Zone Setting Status: 755011
Zone Setting Status: 755011

## 2022-11-03 NOTE — Progress Notes (Signed)
 Remote pacemaker transmission.   

## 2022-12-28 ENCOUNTER — Ambulatory Visit: Payer: Medicare Other | Attending: Physician Assistant | Admitting: Physician Assistant

## 2022-12-28 VITALS — BP 128/74 | HR 73 | Ht 68.5 in | Wt 149.8 lb

## 2022-12-28 DIAGNOSIS — I480 Paroxysmal atrial fibrillation: Secondary | ICD-10-CM

## 2022-12-28 DIAGNOSIS — I340 Nonrheumatic mitral (valve) insufficiency: Secondary | ICD-10-CM

## 2022-12-28 DIAGNOSIS — Z950 Presence of cardiac pacemaker: Secondary | ICD-10-CM

## 2022-12-28 DIAGNOSIS — I10 Essential (primary) hypertension: Secondary | ICD-10-CM

## 2022-12-28 NOTE — Patient Instructions (Signed)
Medication Instructions:  NO CHANGES *If you need a refill on your cardiac medications before your next appointment, please call your pharmacy*   Lab Work: NO LABS If you have labs (blood work) drawn today and your tests are completely normal, you will receive your results only by: MyChart Message (if you have MyChart) OR A paper copy in the mail If you have any lab test that is abnormal or we need to change your treatment, we will call you to review the results.   Testing/Procedures: NO TESTING   Follow-Up: At [facility], you and your health needs are our priority.  As part of our continuing mission to provide you with exceptional heart care, we have created designated Provider Care Teams.  These Care Teams include your primary Cardiologist (physician) and Advanced Practice Providers (APPs -  Physician Assistants and Nurse Practitioners) who all work together to provide you with the care you need, when you need it.    Your next appointment:   1 year(s)  Provider:   Peter Swaziland, MD

## 2022-12-28 NOTE — Progress Notes (Unsigned)
  Cardiology Office Note:  .   Date:  12/28/2022  ID:  Scott Richards, DOB 1926/10/27, MRN [id number] PCP: Lorenda Ishihara, MD  [facility] Providers Cardiologist:  Peter [place]az[place]and, MD { Click to update primary MD,subspecialty MD or APP then REFRESH:1}   History of Present Illness: .   DOT Scott Richards is a 89 y.o. male with past medical history of paroxysmal atrial fibrillation/flutter, CHB s/p PPM 09/2019, HTN and vertigo.  Echocardiogram in September 2022 showed EF 55 to 60%, mild to moderate AI/TR, mild aortic stenosis, mild MR.  He was last seen by Dr. Swaziland in February 2023 at which time he was doing well.  Patient presents today for annual follow-up accompanied by daughter.  He is hard of hearing and has some degree of dementia.  He still lives by himself.  He has shortness of breath with more strenuous activity but not with everyday activity.  On physical exam, his mitral valve murmur is louder, however he has no orthopnea or PND.  Given his advanced age, I recommend to hold off on echocardiogram unless he has change in his overall symptom.  Overall, he is doing well for 89 year old gentleman.  He can follow-up in 1 year.  His lab work is being followed by PCP.  ROS: ***  Studies Reviewed: .        *** Risk Assessment/Calculations:   {Does this patient have ATRIAL FIBRILLATION?:[phone]}         Physical Exam:   VS:  BP 128/74   Pulse 73   Ht 5' 8.5" (1.74 m)   Wt 149 lb 12.8 oz (67.9 kg)   SpO2 95%   BMI 22.45 kg/m    Wt Readings from Last 3 Encounters:  12/28/22 149 lb 12.8 oz (67.9 kg)  01/23/22 148 lb (67.1 kg)  06/16/21 144 lb (65.3 kg)    GEN: Well nourished, well developed in no acute distress NECK: No JVD; No carotid bruits CARDIAC: ***RRR, no murmurs, rubs, gallops RESPIRATORY:  Clear to auscultation without rales, wheezing or rhonchi  ABDOMEN: Soft, non-tender, non-distended EXTREMITIES:  No edema; No deformity   ASSESSMENT AND PLAN: .   ***     {Are you ordering a CV Procedure (e.g. stress test, cath, DCCV, TEE, etc)?   Press F2        :[id number]}  Dispo: ***  Signed, Azalee Course, PA

## 2023-01-12 LAB — CUP PACEART REMOTE DEVICE CHECK
Battery Remaining Longevity: 104 mo
Battery Voltage: 2.99 V
Brady Statistic AP VP Percent: 73.17 %
Brady Statistic AP VS Percent: 0.02 %
Brady Statistic AS VP Percent: 24.36 %
Brady Statistic AS VS Percent: 2.41 %
Brady Statistic RA Percent Paced: 23.13 %
Brady Statistic RV Percent Paced: 91.49 %
Date Time Interrogation Session: 20240924180002
Implantable Lead Connection Status: 753985
Implantable Lead Connection Status: 753985
Implantable Lead Implant Date: 20210628
Implantable Lead Implant Date: 20210628
Implantable Lead Location: 753859
Implantable Lead Location: 753860
Implantable Lead Model: 3830
Implantable Lead Model: 5076
Implantable Pulse Generator Implant Date: 20210628
Lead Channel Impedance Value: 304 Ohm
Lead Channel Impedance Value: 361 Ohm
Lead Channel Impedance Value: 380 Ohm
Lead Channel Impedance Value: 513 Ohm
Lead Channel Pacing Threshold Amplitude: 0.625 V
Lead Channel Pacing Threshold Amplitude: 1.25 V
Lead Channel Pacing Threshold Pulse Width: 0.4 ms
Lead Channel Pacing Threshold Pulse Width: 0.4 ms
Lead Channel Sensing Intrinsic Amplitude: 1.375 mV
Lead Channel Sensing Intrinsic Amplitude: 1.375 mV
Lead Channel Sensing Intrinsic Amplitude: 16 mV
Lead Channel Sensing Intrinsic Amplitude: 16 mV
Lead Channel Setting Pacing Amplitude: 1.5 V
Lead Channel Setting Pacing Amplitude: 2.5 V
Lead Channel Setting Pacing Pulse Width: 0.4 ms
Lead Channel Setting Sensing Sensitivity: 1.2 mV
Zone Setting Status: 755011
Zone Setting Status: 755011

## 2023-01-13 ENCOUNTER — Ambulatory Visit: Payer: Medicare Other

## 2023-01-13 DIAGNOSIS — R55 Syncope and collapse: Secondary | ICD-10-CM | POA: Diagnosis not present

## 2023-01-13 DIAGNOSIS — I480 Paroxysmal atrial fibrillation: Secondary | ICD-10-CM

## 2023-01-15 DIAGNOSIS — Z23 Encounter for immunization: Secondary | ICD-10-CM | POA: Diagnosis not present

## 2023-01-15 DIAGNOSIS — Z0000 Encounter for general adult medical examination without abnormal findings: Secondary | ICD-10-CM | POA: Diagnosis not present

## 2023-01-15 DIAGNOSIS — I4891 Unspecified atrial fibrillation: Secondary | ICD-10-CM | POA: Diagnosis not present

## 2023-01-15 DIAGNOSIS — N1831 Chronic kidney disease, stage 3a: Secondary | ICD-10-CM | POA: Diagnosis not present

## 2023-01-15 DIAGNOSIS — R413 Other amnesia: Secondary | ICD-10-CM | POA: Diagnosis not present

## 2023-01-15 DIAGNOSIS — D6859 Other primary thrombophilia: Secondary | ICD-10-CM | POA: Diagnosis not present

## 2023-01-15 DIAGNOSIS — I4892 Unspecified atrial flutter: Secondary | ICD-10-CM | POA: Diagnosis not present

## 2023-01-15 DIAGNOSIS — I10 Essential (primary) hypertension: Secondary | ICD-10-CM | POA: Diagnosis not present

## 2023-01-29 NOTE — Progress Notes (Signed)
Remote pacemaker transmission.   

## 2023-02-18 ENCOUNTER — Encounter: Payer: Self-pay | Admitting: Internal Medicine

## 2023-02-18 ENCOUNTER — Ambulatory Visit: Payer: Medicare Other | Attending: Internal Medicine | Admitting: Internal Medicine

## 2023-02-18 VITALS — BP 98/48 | HR 62 | Ht 68.5 in | Wt 144.8 lb

## 2023-02-18 DIAGNOSIS — I442 Atrioventricular block, complete: Secondary | ICD-10-CM | POA: Diagnosis not present

## 2023-02-18 LAB — CUP PACEART INCLINIC DEVICE CHECK
Battery Remaining Longevity: 105 mo
Battery Voltage: 2.99 V
Brady Statistic AP VP Percent: 74.11 %
Brady Statistic AP VS Percent: 0.03 %
Brady Statistic AS VP Percent: 23.76 %
Brady Statistic AS VS Percent: 2.09 %
Brady Statistic RA Percent Paced: 56.25 %
Brady Statistic RV Percent Paced: 95.36 %
Date Time Interrogation Session: 20241031152327
Implantable Lead Connection Status: 753985
Implantable Lead Connection Status: 753985
Implantable Lead Implant Date: 20210628
Implantable Lead Implant Date: 20210628
Implantable Lead Location: 753859
Implantable Lead Location: 753860
Implantable Lead Model: 3830
Implantable Lead Model: 5076
Implantable Pulse Generator Implant Date: 20210628
Lead Channel Impedance Value: 361 Ohm
Lead Channel Impedance Value: 399 Ohm
Lead Channel Impedance Value: 456 Ohm
Lead Channel Impedance Value: 551 Ohm
Lead Channel Pacing Threshold Amplitude: 0.75 V
Lead Channel Pacing Threshold Amplitude: 1 V
Lead Channel Pacing Threshold Amplitude: 1.25 V
Lead Channel Pacing Threshold Pulse Width: 0.4 ms
Lead Channel Pacing Threshold Pulse Width: 0.4 ms
Lead Channel Pacing Threshold Pulse Width: 0.4 ms
Lead Channel Sensing Intrinsic Amplitude: 1.25 mV
Lead Channel Sensing Intrinsic Amplitude: 1.5 mV
Lead Channel Sensing Intrinsic Amplitude: 13.75 mV
Lead Channel Sensing Intrinsic Amplitude: 17.125 mV
Lead Channel Setting Pacing Amplitude: 1.5 V
Lead Channel Setting Pacing Amplitude: 2.5 V
Lead Channel Setting Pacing Pulse Width: 0.4 ms
Lead Channel Setting Sensing Sensitivity: 1.2 mV
Zone Setting Status: 755011
Zone Setting Status: 755011

## 2023-02-18 MED ORDER — METOPROLOL SUCCINATE ER 25 MG PO TB24: 25.0000 mg | ORAL_TABLET | Freq: Every day | ORAL | 3 refills | Status: DC

## 2023-02-18 NOTE — Progress Notes (Signed)
HPI Mr. Givan returns today for followup. He is a pleasant 89 yo man with a h/o HTN, high grade/complete heart block, s/p PPM insertion. He has done well since his PPM was inserted. He has not had any dizzy spells and remains active. He has developed atypical atrial flutter and has been placed on xarelto. He denies palpitations. He has had worsening dyspnea and had an echo 9/22 demonstrating preserved LV function and mild AS. Since I saw him last, he feels well. He remains active.  Allergies  Allergen Reactions   Sulfa Antibiotics     Pt can't recall reaction     Current Outpatient Medications  Medication Sig Dispense Refill   donepezil (ARICEPT) 5 MG tablet Take 5 mg by mouth at bedtime.     metoprolol succinate (TOPROL XL) 25 MG 24 hr tablet Take 1 tablet (25 mg total) by mouth daily. 90 tablet 3   telmisartan (MICARDIS) 20 MG tablet Take 20 mg by mouth daily.      VITAMIN D, CHOLECALCIFEROL, PO Take 1 capsule by mouth daily.     XARELTO 15 MG TABS tablet Take 15 mg by mouth daily.     Docusate Sodium (COLACE PO) Take 1 tablet by mouth daily. (Patient not taking: Reported on 12/28/2022)     No current facility-administered medications for this visit.     Past Medical History:  Diagnosis Date   Arrhythmia    Atrial flutter ([facility])    Hypertension    Vertigo     ROS:   All systems reviewed and negative except as noted in the HPI.   Past Surgical History:  Procedure Laterality Date   ANKLE ARTHROSCOPY     PACEMAKER IMPLANT N/A 10/16/2019   Procedure: PACEMAKER IMPLANT;  Surgeon: Marinus Maw, MD;  Location: [facility];  Service: Cardiovascular;  Laterality: N/A;   TONSILLECTOMY       Family History  Problem Relation Age of Onset   Dementia Mother      Social History   Socioeconomic History   Marital status: Widowed    Spouse name: Not on file   Number of children: 1   Years of education: Not on file   Highest education level: Not on file   Occupational History   Occupation: retired- Air traffic controller  Tobacco Use   Smoking status: Former   Smokeless tobacco: Never  Substance and Sexual Activity   Alcohol use: Yes   Drug use: Not on file   Sexual activity: Not on file  Other Topics Concern   Not on file  Social History Narrative   Not on file   Social Determinants of Health   Financial Resource Strain: Not on file  Food Insecurity: Not on file  Transportation Needs: Not on file  Physical Activity: Not on file  Stress: Not on file  Social Connections: Not on file  Intimate Partner Violence: Not on file     BP (!) 98/48 (BP Location: Left Arm, Patient Position: Sitting, Cuff Size: Normal)   Pulse 62   Ht 5' 8.5" (1.74 m)   Wt 144 lb 12.8 oz (65.7 kg)   SpO2 98%   BMI 21.70 kg/m   Physical Exam:  Well appearing NAD HEENT: Unremarkable Neck:  No JVD, no thyromegally Lymphatics:  No adenopathy Back:  No CVA tenderness Lungs:  Clear with no wheezes HEART:  Regular rate rhythm, no murmurs, no rubs, no clicks Abd:  soft, positive bowel sounds, no  organomegally, no rebound, no guarding Ext:  2 plus pulses, no edema, no cyanosis, no clubbing Skin:  No rashes no nodules Neuro:  CN II through XII intact, motor grossly intact  DEVICE  Normal device function.  See PaceArt for details.   Assess/Plan:  1. Atypical atrial flutter/fib - the patient has not had any symptoms despite going out of rhythm after his PPM insertion. He will need to continue systemic anti-coagulation indefinitely. I paced him back to NSR but he had immediate return to atrial flutter after about 15 seconds. Or less. 2. HTN - his sbp is good. He will continue to take his current meds. He will need to keep a check on his bp at home.  3. PPM - his medtronic DDD PM is working normally. We will recheck in several months.   Sharlot Gowda Stefany Starace,MD

## 2023-02-18 NOTE — Patient Instructions (Addendum)
Medication Instructions:  Your physician has recommended you make the following change in your medication:  Start metoprolol succinate 25 mg daily.  Lab Work: None ordered.  If you have labs (blood work) drawn today and your tests are completely normal, you will receive your results only by: MyChart Message (if you have MyChart) OR A paper copy in the mail If you have any lab test that is abnormal or we need to change your treatment, we will call you to review the results.  Testing/Procedures: None ordered.  Follow-Up: At [facility], you and your health needs are our priority.  As part of our continuing mission to provide you with exceptional heart care, we have created designated Provider Care Teams.  These Care Teams include your primary Cardiologist (physician) and Advanced Practice Providers (APPs -  Physician Assistants and Nurse Practitioners) who all work together to provide you with the care you need, when you need it.  Your next appointment:   2 week for Nurse visit EKG AND Blood pressure check  The format for your next appointment:   In Person  Provider:   Lewayne Bunting, MD{or one of the following Advanced Practice Providers on your designated Care Team:   Francis Dowse, [place] Casimiro Needle "Mardelle Matte" [place], [place] Earnest Rosier, NP  Remote monitoring is used to monitor your Pacemaker/ ICD from home. This monitoring reduces the number of office visits required to check your device to one time per year. It allows [place] to keep an eye on the functioning of your device to ensure it is working properly.   Important Information About Sugar

## 2023-03-04 ENCOUNTER — Ambulatory Visit: Payer: Medicare Other | Attending: Cardiovascular Disease

## 2023-03-04 VITALS — BP 112/68 | HR 62 | Resp 16 | Ht 68.5 in | Wt 142.6 lb

## 2023-03-04 DIAGNOSIS — I484 Atypical atrial flutter: Secondary | ICD-10-CM | POA: Diagnosis not present

## 2023-03-04 NOTE — Progress Notes (Signed)
   Nurse Visit   Date of Encounter: 03/04/2023 ID: Scott Richards, DOB 11/26/1926, MRN [id number]  PCP:  Lorenda Ishihara, MD   [facility] Providers Cardiologist:  Peter Swaziland, MD      Visit Details   VS:  BP 112/68 (BP Location: Right Arm, Patient Position: Sitting, Cuff Size: Normal)   Pulse 62   Resp 16   Ht 5' 8.5" (1.74 m)   Wt 142 lb 9.6 oz (64.7 kg)   SpO2 96%   BMI 21.37 kg/m  , BMI Body mass index is 21.37 kg/m.  Wt Readings from Last 3 Encounters:  03/04/23 142 lb 9.6 oz (64.7 kg)  02/18/23 144 lb 12.8 oz (65.7 kg)  12/28/22 149 lb 12.8 oz (67.9 kg)     Reason for visit: EKG and BP check Performed today: Vitals, EKG, Provider consulted:Dr. Eden Emms, and Education Changes (medications, testing, etc.) : none Length of Visit: 20 minutes    Medications Adjustments/Labs and Tests Ordered: Orders Placed This Encounter  Procedures   EKG 12-Lead   No orders of the defined types were placed in this encounter.  Patient was started on metoprolol last office visit with Dr Ladona Ridgel on 02/18/23. Patient reports, doing well with the addition, no concerns at this time.  Signed, Festus Holts, RN  03/04/2023 11:45 AM

## 2023-03-09 ENCOUNTER — Encounter: Payer: Self-pay | Admitting: Internal Medicine

## 2023-03-15 DIAGNOSIS — D045 Carcinoma in situ of skin of trunk: Secondary | ICD-10-CM | POA: Diagnosis not present

## 2023-03-15 DIAGNOSIS — L814 Other melanin hyperpigmentation: Secondary | ICD-10-CM | POA: Diagnosis not present

## 2023-03-15 DIAGNOSIS — D225 Melanocytic nevi of trunk: Secondary | ICD-10-CM | POA: Diagnosis not present

## 2023-03-15 DIAGNOSIS — L821 Other seborrheic keratosis: Secondary | ICD-10-CM | POA: Diagnosis not present

## 2023-03-15 DIAGNOSIS — Z85828 Personal history of other malignant neoplasm of skin: Secondary | ICD-10-CM | POA: Diagnosis not present

## 2023-03-15 DIAGNOSIS — D0461 Carcinoma in situ of skin of right upper limb, including shoulder: Secondary | ICD-10-CM | POA: Diagnosis not present

## 2023-03-15 DIAGNOSIS — L570 Actinic keratosis: Secondary | ICD-10-CM | POA: Diagnosis not present

## 2023-04-13 ENCOUNTER — Ambulatory Visit (INDEPENDENT_AMBULATORY_CARE_PROVIDER_SITE_OTHER): Payer: Medicare Other

## 2023-04-13 DIAGNOSIS — R55 Syncope and collapse: Secondary | ICD-10-CM | POA: Diagnosis not present

## 2023-04-15 LAB — CUP PACEART REMOTE DEVICE CHECK
Battery Remaining Longevity: 101 mo
Battery Voltage: 2.99 V
Brady Statistic AP VP Percent: 86.72 %
Brady Statistic AP VS Percent: 0.01 %
Brady Statistic AS VP Percent: 9.13 %
Brady Statistic AS VS Percent: 4.15 %
Brady Statistic RA Percent Paced: 89.25 %
Brady Statistic RV Percent Paced: 95.7 %
Date Time Interrogation Session: 20241226092939
Implantable Lead Connection Status: 753985
Implantable Lead Connection Status: 753985
Implantable Lead Implant Date: 20210628
Implantable Lead Implant Date: 20210628
Implantable Lead Location: 753859
Implantable Lead Location: 753860
Implantable Lead Model: 3830
Implantable Lead Model: 5076
Implantable Pulse Generator Implant Date: 20210628
Lead Channel Impedance Value: 304 Ohm
Lead Channel Impedance Value: 361 Ohm
Lead Channel Impedance Value: 380 Ohm
Lead Channel Impedance Value: 532 Ohm
Lead Channel Pacing Threshold Amplitude: 0.75 V
Lead Channel Pacing Threshold Amplitude: 1.25 V
Lead Channel Pacing Threshold Pulse Width: 0.4 ms
Lead Channel Pacing Threshold Pulse Width: 0.4 ms
Lead Channel Sensing Intrinsic Amplitude: 0.75 mV
Lead Channel Sensing Intrinsic Amplitude: 0.75 mV
Lead Channel Sensing Intrinsic Amplitude: 16.25 mV
Lead Channel Sensing Intrinsic Amplitude: 16.25 mV
Lead Channel Setting Pacing Amplitude: 1.5 V
Lead Channel Setting Pacing Amplitude: 2.5 V
Lead Channel Setting Pacing Pulse Width: 0.4 ms
Lead Channel Setting Sensing Sensitivity: 1.2 mV
Zone Setting Status: 755011
Zone Setting Status: 755011

## 2023-05-24 NOTE — Progress Notes (Signed)
 Remote pacemaker transmission.

## 2023-05-24 NOTE — Addendum Note (Signed)
Addended by: Geralyn Flash D on: 05/24/2023 03:20 PM   Modules accepted: Orders

## 2023-07-13 ENCOUNTER — Ambulatory Visit: Payer: Medicare Other

## 2023-07-13 DIAGNOSIS — I442 Atrioventricular block, complete: Secondary | ICD-10-CM | POA: Diagnosis not present

## 2023-07-13 LAB — CUP PACEART REMOTE DEVICE CHECK
Battery Remaining Longevity: 97 mo
Battery Voltage: 2.99 V
Brady Statistic AP VP Percent: 85.19 %
Brady Statistic AP VS Percent: 0.01 %
Brady Statistic AS VP Percent: 10.89 %
Brady Statistic AS VS Percent: 3.9 %
Brady Statistic RA Percent Paced: 50.4 %
Brady Statistic RV Percent Paced: 94.6 %
Date Time Interrogation Session: 20250325032259
Implantable Lead Connection Status: 753985
Implantable Lead Connection Status: 753985
Implantable Lead Implant Date: 20210628
Implantable Lead Implant Date: 20210628
Implantable Lead Location: 753859
Implantable Lead Location: 753860
Implantable Lead Model: 3830
Implantable Lead Model: 5076
Implantable Pulse Generator Implant Date: 20210628
Lead Channel Impedance Value: 304 Ohm
Lead Channel Impedance Value: 361 Ohm
Lead Channel Impedance Value: 380 Ohm
Lead Channel Impedance Value: 513 Ohm
Lead Channel Pacing Threshold Amplitude: 0.625 V
Lead Channel Pacing Threshold Amplitude: 1 V
Lead Channel Pacing Threshold Pulse Width: 0.4 ms
Lead Channel Pacing Threshold Pulse Width: 0.4 ms
Lead Channel Sensing Intrinsic Amplitude: 0.875 mV
Lead Channel Sensing Intrinsic Amplitude: 0.875 mV
Lead Channel Sensing Intrinsic Amplitude: 16.625 mV
Lead Channel Sensing Intrinsic Amplitude: 16.625 mV
Lead Channel Setting Pacing Amplitude: 1.5 V
Lead Channel Setting Pacing Amplitude: 2.5 V
Lead Channel Setting Pacing Pulse Width: 0.4 ms
Lead Channel Setting Sensing Sensitivity: 1.2 mV
Zone Setting Status: 755011
Zone Setting Status: 755011

## 2023-07-14 ENCOUNTER — Telehealth: Payer: Self-pay

## 2023-07-14 NOTE — Telephone Encounter (Signed)
 Scheduled remote reviewed. Normal device function.  Presenting rhythm: AF/VP-VS.  Persistent AF since 06/04/23, V rates are overall controlled , burden 43.2% (up from .9% since 12/266/24, on Xarelto per Epic; routed to clinic for ongoing and persistent AF.   Left message for daughter Allison Quarry) to call back (okay per DPR) to evaluate for patient symptoms.

## 2023-07-15 ENCOUNTER — Encounter: Payer: Self-pay | Admitting: Internal Medicine

## 2023-07-16 DIAGNOSIS — D6869 Other thrombophilia: Secondary | ICD-10-CM | POA: Diagnosis not present

## 2023-07-16 DIAGNOSIS — I10 Essential (primary) hypertension: Secondary | ICD-10-CM | POA: Diagnosis not present

## 2023-07-16 DIAGNOSIS — I4892 Unspecified atrial flutter: Secondary | ICD-10-CM | POA: Diagnosis not present

## 2023-07-16 DIAGNOSIS — R54 Age-related physical debility: Secondary | ICD-10-CM | POA: Diagnosis not present

## 2023-07-16 DIAGNOSIS — R634 Abnormal weight loss: Secondary | ICD-10-CM | POA: Diagnosis not present

## 2023-07-16 DIAGNOSIS — N1831 Chronic kidney disease, stage 3a: Secondary | ICD-10-CM | POA: Diagnosis not present

## 2023-07-16 NOTE — Telephone Encounter (Signed)
 LMTCB

## 2023-07-19 NOTE — Telephone Encounter (Signed)
LM on daughters cell requesting call back.

## 2023-07-20 NOTE — Telephone Encounter (Signed)
 Pt seen by PCP 07/16/2023.  See Epic for office note.  Documentation reflects Aflutter with controlled response.  No symptoms related to arrhythmia noted.  Noted compliance with OAC and beta blocker.  No further action needed at this time.

## 2023-08-26 NOTE — Progress Notes (Signed)
 Remote pacemaker transmission.

## 2023-10-12 ENCOUNTER — Ambulatory Visit (INDEPENDENT_AMBULATORY_CARE_PROVIDER_SITE_OTHER): Payer: Medicare Other

## 2023-10-12 DIAGNOSIS — I442 Atrioventricular block, complete: Secondary | ICD-10-CM

## 2023-10-13 ENCOUNTER — Ambulatory Visit: Payer: Self-pay | Admitting: Internal Medicine

## 2023-10-13 LAB — CUP PACEART REMOTE DEVICE CHECK
Battery Remaining Longevity: 88 mo
Battery Voltage: 2.98 V
Brady Statistic RA Percent Paced: 0.07 %
Brady Statistic RV Percent Paced: 92.59 %
Date Time Interrogation Session: 20250624024132
Implantable Lead Connection Status: 753985
Implantable Lead Connection Status: 753985
Implantable Lead Implant Date: 20210628
Implantable Lead Implant Date: 20210628
Implantable Lead Location: 753859
Implantable Lead Location: 753860
Implantable Lead Model: 3830
Implantable Lead Model: 5076
Implantable Pulse Generator Implant Date: 20210628
Lead Channel Impedance Value: 304 Ohm
Lead Channel Impedance Value: 361 Ohm
Lead Channel Impedance Value: 361 Ohm
Lead Channel Impedance Value: 513 Ohm
Lead Channel Pacing Threshold Amplitude: 0.625 V
Lead Channel Pacing Threshold Amplitude: 1.375 V
Lead Channel Pacing Threshold Pulse Width: 0.4 ms
Lead Channel Pacing Threshold Pulse Width: 0.4 ms
Lead Channel Sensing Intrinsic Amplitude: 1 mV
Lead Channel Sensing Intrinsic Amplitude: 1 mV
Lead Channel Sensing Intrinsic Amplitude: 16.75 mV
Lead Channel Sensing Intrinsic Amplitude: 16.75 mV
Lead Channel Setting Pacing Amplitude: 1.5 V
Lead Channel Setting Pacing Amplitude: 2.75 V
Lead Channel Setting Pacing Pulse Width: 0.4 ms
Lead Channel Setting Sensing Sensitivity: 1.2 mV
Zone Setting Status: 755011
Zone Setting Status: 755011

## 2023-11-11 DIAGNOSIS — M7989 Other specified soft tissue disorders: Secondary | ICD-10-CM | POA: Diagnosis not present

## 2023-11-11 DIAGNOSIS — M25472 Effusion, left ankle: Secondary | ICD-10-CM | POA: Diagnosis not present

## 2023-11-11 DIAGNOSIS — M25572 Pain in left ankle and joints of left foot: Secondary | ICD-10-CM | POA: Diagnosis not present

## 2024-01-11 ENCOUNTER — Ambulatory Visit (INDEPENDENT_AMBULATORY_CARE_PROVIDER_SITE_OTHER): Payer: Medicare Other

## 2024-01-11 DIAGNOSIS — I442 Atrioventricular block, complete: Secondary | ICD-10-CM

## 2024-01-11 LAB — CUP PACEART REMOTE DEVICE CHECK
Battery Remaining Longevity: 77 mo
Battery Voltage: 2.97 V
Brady Statistic RA Percent Paced: 0.04 %
Brady Statistic RV Percent Paced: 94.51 %
Date Time Interrogation Session: 20250923053505
Implantable Lead Connection Status: 753985
Implantable Lead Connection Status: 753985
Implantable Lead Implant Date: 20210628
Implantable Lead Implant Date: 20210628
Implantable Lead Location: 753859
Implantable Lead Location: 753860
Implantable Lead Model: 3830
Implantable Lead Model: 5076
Implantable Pulse Generator Implant Date: 20210628
Lead Channel Impedance Value: 285 Ohm
Lead Channel Impedance Value: 304 Ohm
Lead Channel Impedance Value: 342 Ohm
Lead Channel Impedance Value: 456 Ohm
Lead Channel Pacing Threshold Amplitude: 0.625 V
Lead Channel Pacing Threshold Amplitude: 1.5 V
Lead Channel Pacing Threshold Pulse Width: 0.4 ms
Lead Channel Pacing Threshold Pulse Width: 0.4 ms
Lead Channel Sensing Intrinsic Amplitude: 1 mV
Lead Channel Sensing Intrinsic Amplitude: 1 mV
Lead Channel Sensing Intrinsic Amplitude: 16.625 mV
Lead Channel Sensing Intrinsic Amplitude: 16.625 mV
Lead Channel Setting Pacing Amplitude: 1.5 V
Lead Channel Setting Pacing Amplitude: 3 V
Lead Channel Setting Pacing Pulse Width: 0.4 ms
Lead Channel Setting Sensing Sensitivity: 1.2 mV
Zone Setting Status: 755011
Zone Setting Status: 755011

## 2024-01-12 NOTE — Progress Notes (Signed)
 Remote PPM Transmission

## 2024-01-16 ENCOUNTER — Ambulatory Visit: Payer: Self-pay | Admitting: Internal Medicine

## 2024-01-28 DIAGNOSIS — Z136 Encounter for screening for cardiovascular disorders: Secondary | ICD-10-CM | POA: Diagnosis not present

## 2024-01-28 DIAGNOSIS — Z23 Encounter for immunization: Secondary | ICD-10-CM | POA: Diagnosis not present

## 2024-01-28 DIAGNOSIS — I4892 Unspecified atrial flutter: Secondary | ICD-10-CM | POA: Diagnosis not present

## 2024-01-28 DIAGNOSIS — R54 Age-related physical debility: Secondary | ICD-10-CM | POA: Diagnosis not present

## 2024-01-28 DIAGNOSIS — R413 Other amnesia: Secondary | ICD-10-CM | POA: Diagnosis not present

## 2024-01-28 DIAGNOSIS — N1831 Chronic kidney disease, stage 3a: Secondary | ICD-10-CM | POA: Diagnosis not present

## 2024-01-28 DIAGNOSIS — Z950 Presence of cardiac pacemaker: Secondary | ICD-10-CM | POA: Diagnosis not present

## 2024-01-28 DIAGNOSIS — I10 Essential (primary) hypertension: Secondary | ICD-10-CM | POA: Diagnosis not present

## 2024-01-28 DIAGNOSIS — I495 Sick sinus syndrome: Secondary | ICD-10-CM | POA: Diagnosis not present

## 2024-01-28 DIAGNOSIS — Z0000 Encounter for general adult medical examination without abnormal findings: Secondary | ICD-10-CM | POA: Diagnosis not present

## 2024-01-28 DIAGNOSIS — E46 Unspecified protein-calorie malnutrition: Secondary | ICD-10-CM | POA: Diagnosis not present

## 2024-01-31 ENCOUNTER — Other Ambulatory Visit: Payer: Self-pay | Admitting: Internal Medicine

## 2024-01-31 DIAGNOSIS — I442 Atrioventricular block, complete: Secondary | ICD-10-CM

## 2024-02-05 NOTE — Progress Notes (Unsigned)
 Cardiology Office Note   Date:  02/09/2024   ID:  Scott Richards, Scott Richards Apr 15, 1927, MRN [id number]  PCP:  Elliot Charm, MD  Cardiologist:  Cline Draheim Swaziland, MD EP: None  Chief Complaint  Patient presents with   Atrial Fibrillation       History of Present Illness: Scott Richards is a 89 y.o. male with a PMH of paroxysmal atrial fibrillation/flutter, CHB s/p PPM 09/2019, HTN, and vertigo, who presents for follow-up.  His last echocardiogram in Sept 2022 showed EF 55-60%, mild-moderate AI/TR, mild AS,mild MR, and mild PR.  On follow up today he is doing well. He is seen with his daughter. He lives alone. His daughter fixes his meals. No SOB.  No dizziness or chest pain. Doesn't have an appetite and has lost his taste. Has lost 11 lbs since last year.     Past Medical History:  Diagnosis Date   Arrhythmia    Atrial flutter ([facility])    Hypertension    Vertigo     Past Surgical History:  Procedure Laterality Date   ANKLE ARTHROSCOPY     PACEMAKER IMPLANT N/A 10/16/2019   Procedure: PACEMAKER IMPLANT;  Surgeon: Waddell Danelle ORN, MD;  Location: [facility];  Service: Cardiovascular;  Laterality: N/A;   TONSILLECTOMY       Current Outpatient Medications  Medication Sig Dispense Refill   donepezil (ARICEPT) 5 MG tablet Take 5 mg by mouth at bedtime.     VITAMIN D, CHOLECALCIFEROL, PO Take 1 capsule by mouth daily.     XARELTO 15 MG TABS tablet Take 15 mg by mouth daily.     metoprolol  succinate (TOPROL -XL) 25 MG 24 hr tablet Take 1 tablet (25 mg total) by mouth daily. 90 tablet 3   No current facility-administered medications for this visit.    Allergies:   Sulfa antibiotics    Social History:  The patient  reports that he has quit smoking. He has never used smokeless tobacco. He reports current alcohol use.   Family History:  The patient's family history includes Dementia in his mother.    ROS:  Please see the history of present illness.   Otherwise,  review of systems are positive for none.   All other systems are reviewed and negative.    PHYSICAL EXAM: VS:  BP 138/76   Pulse 84   Ht 5' 8.5 (1.74 m)   Wt 131 lb 12.8 oz (59.8 kg)   SpO2 97%   BMI 19.75 kg/m  , BMI Body mass index is 19.75 kg/m. GEN: Well nourished, well developed, in no acute distresss HEENT: sclera anicteric Neck: no JVD, carotid bruits, or masses Cardiac: RRR; + 2/6 high pitched murmur at apex, no rubs or gallops,no edema, pacer site looks good.  Respiratory:  clear to auscultation bilaterally, normal work of breathing GI: soft, nontender, nondistended, + BS MS: no deformity or atrophy Skin: warm and dry, no rash Neuro:  Strength and sensation are intact Psych: euthymic mood, full affect    Recent Labs: No results found for requested labs within last 365 days.    Lipid Panel No results found for: CHOL, TRIG, HDL, CHOLHDL, VLDL, LDLCALC, LDLDIRECT   Dated 11/28/18: cholesterol 171, triglycerides 88, HDL 56, LDL 97.  Dated 12/02/20: CBC, CMET and TSH normal.  Wt Readings from Last 3 Encounters:  02/09/24 131 lb 12.8 oz (59.8 kg)  03/04/23 142 lb 9.6 oz (64.7 kg)  02/18/23 144 lb 12.8 oz (65.7 kg)  Other studies Reviewed: Additional studies/ records that were reviewed today include:   EKG Interpretation Date/Time:  Wednesday February 09 2024 14:45:42 EDT Ventricular Rate:  84 PR Interval:  176 QRS Duration:  152 QT Interval:  422 QTC Calculation: 498 R Axis:   -57  Text Interpretation: AV dual-paced rhythm with frequent Premature ventricular complexes When compared with ECG of 04-Mar-2023 11:18, Premature ventricular complexes are now Present Vent. rate has increased BY  22 BPM Confirmed by [place], Yerachmiel Spinney [phone]) on 02/09/2024 2:56:43 PM    Echo 01/03/21: IMPRESSIONS     1. Left ventricular ejection fraction, by estimation, is 55 to 60%. The  left ventricle has normal function. The left ventricle has no regional  wall  motion abnormalities. There is mild asymmetric left ventricular  hypertrophy of the basal-septal segment.  Left ventricular diastolic function could not be evaluated. The average  left ventricular global longitudinal strain is -19.3 %. The global  longitudinal strain is normal.   2. Right ventricular systolic function is normal. The right ventricular  size is normal. There is normal pulmonary artery systolic pressure. The  estimated right ventricular systolic pressure is 34.6 mmHg.   3. Right atrial size was mild to moderately dilated.   4. The mitral valve is normal in structure. Mild mitral valve  regurgitation. No evidence of mitral stenosis.   5. Tricuspid valve regurgitation is moderate.   6. The aortic valve is calcified. There is moderate calcification of the  aortic valve. There is moderate thickening of the aortic valve. Aortic  valve regurgitation is mild to moderate. Mild aortic valve stenosis.  Aortic valve area, by VTI measures 1.13  cm. Aortic valve mean gradient measures 11.6 mmHg. Aortic valve Vmax  measures 2.35 m/s.   7. The inferior vena cava is normal in size with greater than 50%  respiratory variability, suggesting right atrial pressure of 3 mmHg.   Comparison(s): Prior images unable to be directly viewed, comparison made  by report only. Report only. 11/25/15 EF 56%.     ASSESSMENT AND PLAN:  1. Paroxysmal atrial fibrillation:Remains relatively asymptomatic  - Continue xarelto for stroke ppx - continue Toprol  XL 25 mg daily.  2. HTN: BP acceptable - Continue Toprol . No longer on ARB - Encourage low salt diet  3. CHB s/p PPM 09/2019:  - Continue routine outpatient monitoring with EP - seeing  Dr. Waddell today  4. Valvulopathy: mild-moderate AI/TR, mild AS,mild MR, and mild PR noted on last echo from Sept 2022. No significant change from 2017. Not really a candidate for any intervention at advanced age.    Current medicines are reviewed at length with the  patient today.  The patient does not have concerns regarding medicines.  The following changes have been made:  As above  Labs/ tests ordered today include:   Orders Placed This Encounter  Procedures   EKG 12-Lead      Disposition:   FU with Dr. Swaziland in 12 months  Signed, Oscar Hank [place], MD  02/09/2024 2:57 PM

## 2024-02-09 ENCOUNTER — Encounter: Payer: Self-pay | Admitting: Cardiology

## 2024-02-09 ENCOUNTER — Ambulatory Visit: Attending: Cardiology | Admitting: Cardiology

## 2024-02-09 ENCOUNTER — Ambulatory Visit: Attending: Cardiology | Admitting: Internal Medicine

## 2024-02-09 VITALS — BP 138/76 | HR 84 | Ht 68.5 in | Wt 131.8 lb

## 2024-02-09 DIAGNOSIS — Z950 Presence of cardiac pacemaker: Secondary | ICD-10-CM | POA: Diagnosis not present

## 2024-02-09 DIAGNOSIS — I340 Nonrheumatic mitral (valve) insufficiency: Secondary | ICD-10-CM

## 2024-02-09 DIAGNOSIS — I442 Atrioventricular block, complete: Secondary | ICD-10-CM

## 2024-02-09 DIAGNOSIS — R55 Syncope and collapse: Secondary | ICD-10-CM

## 2024-02-09 DIAGNOSIS — I484 Atypical atrial flutter: Secondary | ICD-10-CM

## 2024-02-09 DIAGNOSIS — I480 Paroxysmal atrial fibrillation: Secondary | ICD-10-CM | POA: Diagnosis not present

## 2024-02-09 DIAGNOSIS — I10 Essential (primary) hypertension: Secondary | ICD-10-CM | POA: Diagnosis not present

## 2024-02-09 MED ORDER — METOPROLOL SUCCINATE ER 25 MG PO TB24: 25.0000 mg | ORAL_TABLET | Freq: Every day | ORAL | 3 refills | Status: AC

## 2024-02-09 NOTE — Progress Notes (Signed)
 HPI Scott Richards returns today for followup. He is a pleasant 89 yo man with a h/o HTN, high grade/complete heart block, s/p PPM insertion. He has done well since his PPM was inserted. He has not had any dizzy spells and remains active. He has developed atypical atrial flutter and has been placed on xarelto. He denies palpitations. He has had worsening dyspnea and had an echo 9/22 demonstrating preserved LV function and mild AS. Since I saw him last, he feels well. He remains active.  Allergies  Allergen Reactions   Sulfa Antibiotics     Pt can't recall reaction     Current Outpatient Medications  Medication Sig Dispense Refill   donepezil (ARICEPT) 5 MG tablet Take 5 mg by mouth at bedtime.     metoprolol  succinate (TOPROL -XL) 25 MG 24 hr tablet Take 1 tablet (25 mg total) by mouth daily. 90 tablet 3   VITAMIN D, CHOLECALCIFEROL, PO Take 1 capsule by mouth daily.     XARELTO 15 MG TABS tablet Take 15 mg by mouth daily.     No current facility-administered medications for this visit.     Past Medical History:  Diagnosis Date   Arrhythmia    Atrial flutter ([facility])    Hypertension    Vertigo     ROS:   All systems reviewed and negative except as noted in the HPI.   Past Surgical History:  Procedure Laterality Date   ANKLE ARTHROSCOPY     PACEMAKER IMPLANT N/A 10/16/2019   Procedure: PACEMAKER IMPLANT;  Surgeon: Waddell Danelle ORN, MD;  Location: [facility];  Service: Cardiovascular;  Laterality: N/A;   TONSILLECTOMY       Family History  Problem Relation Age of Onset   Dementia Mother      Social History   Socioeconomic History   Marital status: Widowed    Spouse name: Not on file   Number of children: 1   Years of education: Not on file   Highest education level: Not on file  Occupational History   Occupation: retired- Air traffic controller  Tobacco Use   Smoking status: Former   Smokeless tobacco: Never  Substance and Sexual Activity   Alcohol  use: Yes   Drug use: Not on file   Sexual activity: Not on file  Other Topics Concern   Not on file  Social History Narrative   Not on file   Social Drivers of Health   Financial Resource Strain: Not on file  Food Insecurity: Not on file  Transportation Needs: Not on file  Physical Activity: Not on file  Stress: Not on file  Social Connections: Not on file  Intimate Partner Violence: Not on file     There were no vitals taken for this visit.  Physical Exam:  Well appearing NAD HEENT: Unremarkable Neck:  No JVD, no thyromegally Lymphatics:  No adenopathy Back:  No CVA tenderness Lungs:  Clear HEART:  Regular rate rhythm, no murmurs, no rubs, no clicks Abd:  soft, positive bowel sounds, no organomegally, no rebound, no guarding Ext:  2 plus pulses, no edema, no cyanosis, no clubbing Skin:  No rashes no nodules Neuro:  CN II through XII intact, motor grossly intact  EKG - nsr with P synchronous and AV sequential pacing.   DEVICE  Normal device function.  See PaceArt for details.   Assess/Plan:  Atypical atrial flutter/fib - the patient has no symptoms. He returned to NSR spontaneously. He did  not feel any different. 2. HTN - his sbp is good. He will continue to take his current meds. He will need to keep a check on his bp at home.  3. PPM - his medtronic DDD PM is working normally. We will recheck in several months.   Danelle Khaleah Duer,MD

## 2024-02-09 NOTE — Patient Instructions (Signed)

## 2024-02-09 NOTE — Patient Instructions (Addendum)

## 2024-02-11 ENCOUNTER — Ambulatory Visit: Admitting: Cardiology

## 2024-02-11 ENCOUNTER — Ambulatory Visit: Admitting: Internal Medicine

## 2024-04-11 ENCOUNTER — Ambulatory Visit: Payer: Medicare Other

## 2024-04-11 DIAGNOSIS — I442 Atrioventricular block, complete: Secondary | ICD-10-CM

## 2024-04-12 LAB — CUP PACEART REMOTE DEVICE CHECK
Battery Remaining Longevity: 84 mo
Battery Voltage: 2.98 V
Brady Statistic AP VP Percent: 80.64 %
Brady Statistic AP VS Percent: 0.02 %
Brady Statistic AS VP Percent: 13.21 %
Brady Statistic AS VS Percent: 6.13 %
Brady Statistic RA Percent Paced: 84.94 %
Brady Statistic RV Percent Paced: 93.85 %
Date Time Interrogation Session: 20251222181652
Implantable Lead Connection Status: 753985
Implantable Lead Connection Status: 753985
Implantable Lead Implant Date: 20210628
Implantable Lead Implant Date: 20210628
Implantable Lead Location: 753859
Implantable Lead Location: 753860
Implantable Lead Model: 3830
Implantable Lead Model: 5076
Implantable Pulse Generator Implant Date: 20210628
Lead Channel Impedance Value: 266 Ohm
Lead Channel Impedance Value: 323 Ohm
Lead Channel Impedance Value: 342 Ohm
Lead Channel Impedance Value: 494 Ohm
Lead Channel Pacing Threshold Amplitude: 0.75 V
Lead Channel Pacing Threshold Amplitude: 1.25 V
Lead Channel Pacing Threshold Pulse Width: 0.4 ms
Lead Channel Pacing Threshold Pulse Width: 0.4 ms
Lead Channel Sensing Intrinsic Amplitude: 1.625 mV
Lead Channel Sensing Intrinsic Amplitude: 1.625 mV
Lead Channel Sensing Intrinsic Amplitude: 14.875 mV
Lead Channel Sensing Intrinsic Amplitude: 14.875 mV
Lead Channel Setting Pacing Amplitude: 1.5 V
Lead Channel Setting Pacing Amplitude: 2.5 V
Lead Channel Setting Pacing Pulse Width: 0.4 ms
Lead Channel Setting Sensing Sensitivity: 1.2 mV
Zone Setting Status: 755011
Zone Setting Status: 755011

## 2024-04-12 NOTE — Progress Notes (Signed)
 Remote PPM Transmission

## 2024-04-23 ENCOUNTER — Ambulatory Visit: Payer: Self-pay | Admitting: Cardiology
